# Patient Record
Sex: Female | Born: 1940 | Race: White | Hispanic: No | Smoking: Never smoker
Health system: Southern US, Community
[De-identification: ages and names within clinical notes are randomized; demographics above are authoritative.]

## PROBLEM LIST (undated history)

## (undated) DIAGNOSIS — J45909 Unspecified asthma, uncomplicated: Secondary | ICD-10-CM

## (undated) DIAGNOSIS — K5792 Diverticulitis of intestine, part unspecified, without perforation or abscess without bleeding: Secondary | ICD-10-CM

## (undated) DIAGNOSIS — E079 Disorder of thyroid, unspecified: Secondary | ICD-10-CM

## (undated) DIAGNOSIS — I10 Essential (primary) hypertension: Secondary | ICD-10-CM

## (undated) HISTORY — DX: Disorder of thyroid, unspecified: E07.9

## (undated) HISTORY — PX: ABDOMINAL HYSTERECTOMY: SHX81

## (undated) HISTORY — DX: Diverticulitis of intestine, part unspecified, without perforation or abscess without bleeding: K57.92

## (undated) HISTORY — DX: Essential (primary) hypertension: I10

## (undated) HISTORY — PX: CHOLECYSTECTOMY: SHX55

## (undated) HISTORY — PX: TUBAL LIGATION: SHX77

## (undated) HISTORY — PX: APPENDECTOMY: SHX54

## (undated) HISTORY — PX: ANTERIOR AND POSTERIOR REPAIR: SHX1172

---

## 2003-05-20 ENCOUNTER — Observation Stay (HOSPITAL_COMMUNITY): Admission: RE | Admit: 2003-05-20 | Discharge: 2003-05-21 | Payer: Self-pay | Admitting: Obstetrics & Gynecology

## 2005-07-27 ENCOUNTER — Ambulatory Visit (HOSPITAL_COMMUNITY): Admission: RE | Admit: 2005-07-27 | Discharge: 2005-07-27 | Payer: Self-pay | Admitting: Family Medicine

## 2005-08-03 ENCOUNTER — Ambulatory Visit (HOSPITAL_COMMUNITY): Admission: RE | Admit: 2005-08-03 | Discharge: 2005-08-03 | Payer: Self-pay | Admitting: General Surgery

## 2006-02-18 ENCOUNTER — Ambulatory Visit: Payer: Self-pay | Admitting: Internal Medicine

## 2006-03-06 ENCOUNTER — Ambulatory Visit (HOSPITAL_COMMUNITY): Admission: RE | Admit: 2006-03-06 | Discharge: 2006-03-06 | Payer: Self-pay | Admitting: Internal Medicine

## 2006-03-06 ENCOUNTER — Ambulatory Visit: Payer: Self-pay | Admitting: Internal Medicine

## 2006-04-05 ENCOUNTER — Ambulatory Visit: Payer: Self-pay | Admitting: Internal Medicine

## 2013-11-06 ENCOUNTER — Other Ambulatory Visit: Payer: Self-pay | Admitting: Obstetrics & Gynecology

## 2013-11-16 ENCOUNTER — Encounter: Payer: Self-pay | Admitting: Obstetrics & Gynecology

## 2013-11-16 ENCOUNTER — Ambulatory Visit (INDEPENDENT_AMBULATORY_CARE_PROVIDER_SITE_OTHER): Payer: Medicare Other | Admitting: Obstetrics & Gynecology

## 2013-11-16 ENCOUNTER — Other Ambulatory Visit (HOSPITAL_COMMUNITY)
Admission: RE | Admit: 2013-11-16 | Discharge: 2013-11-16 | Disposition: A | Discharge status: Home / Self Care | Payer: Medicare Other | Source: Ambulatory Visit | Attending: Obstetrics & Gynecology | Admitting: Obstetrics & Gynecology

## 2013-11-16 VITALS — BP 160/100 | Ht 64.0 in | Wt 226.0 lb

## 2013-11-16 DIAGNOSIS — Z01419 Encounter for gynecological examination (general) (routine) without abnormal findings: Secondary | ICD-10-CM

## 2013-11-16 DIAGNOSIS — Z124 Encounter for screening for malignant neoplasm of cervix: Secondary | ICD-10-CM

## 2013-11-16 DIAGNOSIS — Z1212 Encounter for screening for malignant neoplasm of rectum: Secondary | ICD-10-CM

## 2013-11-16 NOTE — Progress Notes (Signed)
Patient ID: Natasha Willis, female   DOB: 05-07-1941, 73 y.o.   MRN: [id number] Subjective:     Natasha Willis is a 73 y.o. female here for a routine exam.  No LMP recorded. No obstetric history on file. Birth Control Method:  na Menstrual Calendar(currently): na  Current complaints: none.   Current acute medical issues:  Vision loss   Recent Gynecologic History No LMP recorded. Last Pap: 2012,  normal Last mammogram: 2014,  normal  Past Medical History  Diagnosis Date  . Diverticulitis     History reviewed. No pertinent past surgical history.  OB History   Grav Para Term Preterm Abortions TAB SAB Ect Mult Living                  History   Social History  . Marital Status: Divorced    Spouse Name: N/A    Number of Children: N/A  . Years of Education: N/A   Social History Main Topics  . Smoking status: Never Smoker   . Smokeless tobacco: None  . Alcohol Use: None  . Drug Use: None  . Sexual Activity: None   Other Topics Concern  . None   Social History Narrative  . None    Family History  Problem Relation Age of Onset  . Diabetes Brother   . Heart disease Brother   . Diabetes Brother      Review of Systems  Review of Systems  Constitutional: Negative for fever, chills, weight loss, malaise/fatigue and diaphoresis.  HENT: Negative for hearing loss, ear pain, nosebleeds, congestion, sore throat, neck pain, tinnitus and ear discharge.   Eyes: Negative for blurred vision, double vision, photophobia, pain, discharge and redness.  Respiratory: Negative for cough, hemoptysis, sputum production, shortness of breath, wheezing and stridor.   Cardiovascular: Negative for chest pain, palpitations, orthopnea, claudication, leg swelling and PND.  Gastrointestinal: negative for abdominal pain. Negative for heartburn, nausea, vomiting, diarrhea, constipation, blood in stool and melena.  Genitourinary: Negative for dysuria, urgency, frequency, hematuria and  flank pain.  Musculoskeletal: Negative for myalgias, back pain, joint pain and falls.  Skin: Negative for itching and rash.  Neurological: Negative for dizziness, tingling, tremors, sensory change, speech change, focal weakness, seizures, loss of consciousness, weakness and headaches.  Endo/Heme/Allergies: Negative for environmental allergies and polydipsia. Does not bruise/bleed easily.  Psychiatric/Behavioral: Negative for depression, suicidal ideas, hallucinations, memory loss and substance abuse. The patient is not nervous/anxious and does not have insomnia.        Objective:    Physical Exam  Vitals reviewed. Constitutional: She is oriented to person, place, and time. She appears well-developed and well-nourished.  HENT:  Head: Normocephalic and atraumatic.        Right Ear: External ear normal.  Left Ear: External ear normal.  Nose: Nose normal.  Mouth/Throat: Oropharynx is clear and moist.  Eyes: Conjunctivae and EOM are normal. Pupils are equal, round, and reactive to light. Right eye exhibits no discharge. Left eye exhibits no discharge. No scleral icterus.  Neck: Normal range of motion. Neck supple. No tracheal deviation present. No thyromegaly present.  Cardiovascular: Normal rate, regular rhythm, normal heart sounds and intact distal pulses.  Exam reveals no gallop and no friction rub.   No murmur heard. Respiratory: Effort normal and breath sounds normal. No respiratory distress. She has no wheezes. She has no rales. She exhibits no tenderness.  GI: Soft. Bowel sounds are normal. She exhibits no distension and no mass. There is no  tenderness. There is no rebound and no guarding.  Genitourinary:  Breasts no masses skin changes or nipple changes bilaterally      Vulva is normal without lesions Vagina is pink moist without discharge Cervix normal in appearance and pap is done Uterus is normal size shape and contour Adnexa is negative with normal adnexa Rectal    hemoccult  negative, normal tone, no masses  Musculoskeletal: Normal range of motion. She exhibits no edema and no tenderness.  Neurological: She is alert and oriented to person, place, and time. She has normal reflexes. She displays normal reflexes. No cranial nerve deficit. She exhibits normal muscle tone. Coordination normal.  Skin: Skin is warm and dry. No rash noted. No erythema. No pallor.  Psychiatric: She has a normal mood and affect. Her behavior is normal. Judgment and thought content normal.       Assessment:   Normal gyn exam  Plan:    Follow up in: 2 years.

## 2013-11-16 NOTE — Addendum Note (Signed)
Addended by: Criss AlvinePULLIAM, Mehar Kirkwood G on: 11/16/2013 03:19 PM   Modules accepted: Orders

## 2013-12-15 DIAGNOSIS — H25019 Cortical age-related cataract, unspecified eye: Secondary | ICD-10-CM | POA: Diagnosis not present

## 2013-12-15 DIAGNOSIS — H52229 Regular astigmatism, unspecified eye: Secondary | ICD-10-CM | POA: Diagnosis not present

## 2013-12-15 DIAGNOSIS — H524 Presbyopia: Secondary | ICD-10-CM | POA: Diagnosis not present

## 2013-12-15 DIAGNOSIS — H5200 Hypermetropia, unspecified eye: Secondary | ICD-10-CM | POA: Diagnosis not present

## 2014-06-16 DIAGNOSIS — H25019 Cortical age-related cataract, unspecified eye: Secondary | ICD-10-CM | POA: Diagnosis not present

## 2014-08-02 DIAGNOSIS — H25811 Combined forms of age-related cataract, right eye: Secondary | ICD-10-CM | POA: Diagnosis not present

## 2014-08-02 DIAGNOSIS — H5203 Hypermetropia, bilateral: Secondary | ICD-10-CM | POA: Diagnosis not present

## 2014-08-02 DIAGNOSIS — H25813 Combined forms of age-related cataract, bilateral: Secondary | ICD-10-CM | POA: Diagnosis not present

## 2014-08-02 DIAGNOSIS — H5010 Unspecified exotropia: Secondary | ICD-10-CM | POA: Diagnosis not present

## 2014-08-02 DIAGNOSIS — H52223 Regular astigmatism, bilateral: Secondary | ICD-10-CM | POA: Diagnosis not present

## 2014-08-17 ENCOUNTER — Encounter (HOSPITAL_COMMUNITY)
Admission: RE | Admit: 2014-08-17 | Discharge: 2014-08-17 | Disposition: A | Discharge status: Home / Self Care | Payer: Medicare Other | Source: Ambulatory Visit | Attending: Ophthalmology | Admitting: Ophthalmology

## 2014-08-17 ENCOUNTER — Encounter (HOSPITAL_COMMUNITY): Payer: Self-pay

## 2014-08-17 ENCOUNTER — Other Ambulatory Visit: Payer: Self-pay

## 2014-08-17 DIAGNOSIS — J302 Other seasonal allergic rhinitis: Secondary | ICD-10-CM | POA: Diagnosis not present

## 2014-08-17 DIAGNOSIS — H25811 Combined forms of age-related cataract, right eye: Secondary | ICD-10-CM | POA: Diagnosis not present

## 2014-08-17 DIAGNOSIS — H52201 Unspecified astigmatism, right eye: Secondary | ICD-10-CM | POA: Diagnosis not present

## 2014-08-17 DIAGNOSIS — Z880 Allergy status to penicillin: Secondary | ICD-10-CM | POA: Diagnosis not present

## 2014-08-17 HISTORY — DX: Unspecified asthma, uncomplicated: J45.909

## 2014-08-17 LAB — CBC
HCT: 46.2 % — ABNORMAL HIGH (ref 36.0–46.0)
Hemoglobin: 15.8 g/dL — ABNORMAL HIGH (ref 12.0–15.0)
MCH: 31.8 pg (ref 26.0–34.0)
MCHC: 34.2 g/dL (ref 30.0–36.0)
MCV: 93 fL (ref 78.0–100.0)
Platelets: 223 10*3/uL (ref 150–400)
RBC: 4.97 MIL/uL (ref 3.87–5.11)
RDW: 13.5 % (ref 11.5–15.5)
WBC: 6.1 10*3/uL (ref 4.0–10.5)

## 2014-08-17 LAB — BASIC METABOLIC PANEL
Anion gap: 11 (ref 5–15)
BUN: 13 mg/dL (ref 6–23)
CO2: 29 mEq/L (ref 19–32)
Calcium: 9.7 mg/dL (ref 8.4–10.5)
Chloride: 102 mEq/L (ref 96–112)
Creatinine, Ser: 0.59 mg/dL (ref 0.50–1.10)
GFR calc Af Amer: >90 mL/min (ref >90)
GFR calc non Af Amer: 89 mL/min — ABNORMAL LOW (ref >90)
GLUCOSE: 117 mg/dL — AB (ref 70–99)
POTASSIUM: 4.5 meq/L (ref 3.7–5.3)
Sodium: 142 mEq/L (ref 137–147)

## 2014-08-17 NOTE — Pre-Procedure Instructions (Signed)
Patient given information to sign up for my chart at home. 

## 2014-08-17 NOTE — Patient Instructions (Signed)
Your procedure is scheduled on: 08/19/2014  Report to [facility] at  640  AM.  Call this number if you have problems the morning of surgery: [phone]   Do not eat food or drink liquids :After Midnight.      Take these medicines the morning of surgery with A SIP OF WATER: none   Do not wear jewelry, make-up or nail polish.  Do not wear lotions, powders, or perfumes.   Do not shave 48 hours prior to surgery.  Do not bring valuables to the hospital.  Contacts, dentures or bridgework may not be worn into surgery.  Leave suitcase in the car. After surgery it may be brought to your room.  For patients admitted to the hospital, checkout time is 11:00 AM the day of discharge.   Patients discharged the day of surgery will not be allowed to drive home.  :     Please read over the following fact sheets that you were given: Coughing and Deep Breathing, Surgical Site Infection Prevention, Anesthesia Post-op Instructions and Care and Recovery After Surgery    Cataract A cataract is a clouding of the lens of the eye. When a lens becomes cloudy, vision is reduced based on the degree and nature of the clouding. Many cataracts reduce vision to some degree. Some cataracts make people more near-sighted as they develop. Other cataracts increase glare. Cataracts that are ignored and become worse can sometimes look white. The white color can be seen through the pupil. CAUSES   Aging. However, cataracts may occur at any age, even in newborns.   Certain drugs.   Trauma to the eye.   Certain diseases such as diabetes.   Specific eye diseases such as chronic inflammation inside the eye or a sudden attack of a rare form of glaucoma.   Inherited or acquired medical problems.  SYMPTOMS   Gradual, progressive drop in vision in the affected eye.   Severe, rapid visual loss. This most often happens when trauma is the cause.  DIAGNOSIS  To detect a cataract, an eye doctor examines the lens. Cataracts are  best diagnosed with an exam of the eyes with the pupils enlarged (dilated) by drops.  TREATMENT  For an early cataract, vision may improve by using different eyeglasses or stronger lighting. If that does not help your vision, surgery is the only effective treatment. A cataract needs to be surgically removed when vision loss interferes with your everyday activities, such as driving, reading, or watching TV. A cataract may also have to be removed if it prevents examination or treatment of another eye problem. Surgery removes the cloudy lens and usually replaces it with a substitute lens (intraocular lens, IOL).  At a time when both you and your doctor agree, the cataract will be surgically removed. If you have cataracts in both eyes, only one is usually removed at a time. This allows the operated eye to heal and be out of danger from any possible problems after surgery (such as infection or poor wound healing). In rare cases, a cataract may be doing damage to your eye. In these cases, your caregiver may advise surgical removal right away. The vast majority of people who have cataract surgery have better vision afterward. HOME CARE INSTRUCTIONS  If you are not planning surgery, you may be asked to do the following:  Use different eyeglasses.   Use stronger or brighter lighting.   Ask your eye doctor about reducing your medicine dose or changing medicines if  it is thought that a medicine caused your cataract. Changing medicines does not make the cataract go away on its own.   Become familiar with your surroundings. Poor vision can lead to injury. Avoid bumping into things on the affected side. You are at a higher risk for tripping or falling.   Exercise extreme care when driving or operating machinery.   Wear sunglasses if you are sensitive to bright light or experiencing problems with glare.  SEEK IMMEDIATE MEDICAL CARE IF:   You have a worsening or sudden vision loss.   You notice redness,  swelling, or increasing pain in the eye.   You have a fever.  Document Released: 09/03/2005 Document Revised: 08/23/2011 Document Reviewed: 04/27/2011 [facility] Patient Information 2012 [place].PATIENT INSTRUCTIONS POST-ANESTHESIA  IMMEDIATELY FOLLOWING SURGERY:  Do not drive or operate machinery for the first twenty four hours after surgery.  Do not make any important decisions for twenty four hours after surgery or while taking narcotic pain medications or sedatives.  If you develop intractable nausea and vomiting or a severe headache please notify your doctor immediately.  FOLLOW-UP:  Please make an appointment with your surgeon as instructed. You do not need to follow up with anesthesia unless specifically instructed to do so.  WOUND CARE INSTRUCTIONS (if applicable):  Keep a dry clean dressing on the anesthesia/puncture wound site if there is drainage.  Once the wound has quit draining you may leave it open to air.  Generally you should leave the bandage intact for twenty four hours unless there is drainage.  If the epidural site drains for more than 36-48 hours please call the anesthesia department.  QUESTIONS?:  Please feel free to call your physician or the hospital operator if you have any questions, and they will be happy to assist you.

## 2014-08-18 MED ORDER — CYCLOPENTOLATE-PHENYLEPHRINE OP SOLN OPTIME - NO CHARGE
OPHTHALMIC | Status: AC
  Filled 2014-08-18: qty 2

## 2014-08-18 MED ORDER — LIDOCAINE HCL (PF) 1 % IJ SOLN
INTRAMUSCULAR | Status: AC
  Filled 2014-08-18: qty 2

## 2014-08-18 MED ORDER — LIDOCAINE HCL 3.5 % OP GEL
OPHTHALMIC | Status: AC
  Filled 2014-08-18: qty 1

## 2014-08-18 MED ORDER — NEOMYCIN-POLYMYXIN-DEXAMETH 3.5-10000-0.1 OP SUSP
OPHTHALMIC | Status: AC
  Filled 2014-08-18: qty 5

## 2014-08-18 MED ORDER — PHENYLEPHRINE HCL 2.5 % OP SOLN
OPHTHALMIC | Status: AC
  Filled 2014-08-18: qty 15

## 2014-08-18 MED ORDER — TETRACAINE HCL 0.5 % OP SOLN
OPHTHALMIC | Status: AC
  Filled 2014-08-18: qty 2

## 2014-08-19 ENCOUNTER — Ambulatory Visit (HOSPITAL_COMMUNITY)
Admission: RE | Admit: 2014-08-19 | Discharge: 2014-08-19 | Disposition: A | Discharge status: Home / Self Care | Payer: Medicare Other | Source: Ambulatory Visit | Attending: Ophthalmology | Admitting: Ophthalmology

## 2014-08-19 ENCOUNTER — Ambulatory Visit (HOSPITAL_COMMUNITY): Payer: Medicare Other | Admitting: Anesthesiology

## 2014-08-19 ENCOUNTER — Encounter (HOSPITAL_COMMUNITY)
Admission: RE | Disposition: A | Discharge status: Home / Self Care | Payer: Self-pay | Source: Ambulatory Visit | Attending: Ophthalmology

## 2014-08-19 ENCOUNTER — Encounter (HOSPITAL_COMMUNITY): Payer: Self-pay | Admitting: *Deleted

## 2014-08-19 DIAGNOSIS — J302 Other seasonal allergic rhinitis: Secondary | ICD-10-CM | POA: Diagnosis not present

## 2014-08-19 DIAGNOSIS — H25811 Combined forms of age-related cataract, right eye: Secondary | ICD-10-CM | POA: Insufficient documentation

## 2014-08-19 DIAGNOSIS — Z880 Allergy status to penicillin: Secondary | ICD-10-CM | POA: Insufficient documentation

## 2014-08-19 DIAGNOSIS — H25011 Cortical age-related cataract, right eye: Secondary | ICD-10-CM | POA: Diagnosis not present

## 2014-08-19 DIAGNOSIS — H52201 Unspecified astigmatism, right eye: Secondary | ICD-10-CM | POA: Diagnosis not present

## 2014-08-19 DIAGNOSIS — H259 Unspecified age-related cataract: Secondary | ICD-10-CM | POA: Diagnosis not present

## 2014-08-19 HISTORY — PX: CATARACT EXTRACTION W/PHACO: SHX586

## 2014-08-19 SURGERY — PHACOEMULSIFICATION, CATARACT, WITH IOL INSERTION
Anesthesia: Monitor Anesthesia Care | Site: Eye | Laterality: Right

## 2014-08-19 MED ORDER — LIDOCAINE HCL 3.5 % OP GEL
1.0000 "application " | Freq: Once | OPHTHALMIC | Status: AC
  Administered 2014-08-19: 1 via OPHTHALMIC

## 2014-08-19 MED ORDER — LIDOCAINE 3.5 % OP GEL OPTIME - NO CHARGE
OPHTHALMIC | Status: DC | PRN
  Administered 2014-08-19: 1 [drp] via OPHTHALMIC

## 2014-08-19 MED ORDER — MIDAZOLAM HCL 2 MG/2ML IJ SOLN
1.0000 mg | INTRAMUSCULAR | Status: DC | PRN
  Administered 2014-08-19: 2 mg via INTRAVENOUS

## 2014-08-19 MED ORDER — LACTATED RINGERS IV SOLN
INTRAVENOUS | Status: DC | PRN
  Administered 2014-08-19: 07:00:00 via INTRAVENOUS

## 2014-08-19 MED ORDER — PROVISC 10 MG/ML IO SOLN
INTRAOCULAR | Status: DC | PRN
  Administered 2014-08-19: 0.85 mL via INTRAOCULAR

## 2014-08-19 MED ORDER — EPINEPHRINE HCL 1 MG/ML IJ SOLN
INTRAMUSCULAR | Status: DC | PRN
  Administered 2014-08-19: 500 mL

## 2014-08-19 MED ORDER — NEOMYCIN-POLYMYXIN-DEXAMETH 3.5-10000-0.1 OP SUSP
OPHTHALMIC | Status: DC | PRN
  Administered 2014-08-19: 1 [drp] via OPHTHALMIC

## 2014-08-19 MED ORDER — POVIDONE-IODINE 5 % OP SOLN
OPHTHALMIC | Status: DC | PRN
  Administered 2014-08-19: 1 via OPHTHALMIC

## 2014-08-19 MED ORDER — PHENYLEPHRINE HCL 2.5 % OP SOLN
1.0000 [drp] | OPHTHALMIC | Status: AC
  Administered 2014-08-19 (×3): 1 [drp] via OPHTHALMIC

## 2014-08-19 MED ORDER — KETOROLAC TROMETHAMINE 0.5 % OP SOLN: 1.0000 [drp] | OPHTHALMIC | Status: DC

## 2014-08-19 MED ORDER — BSS IO SOLN
INTRAOCULAR | Status: DC | PRN
  Administered 2014-08-19: 30 mL via INTRAOCULAR

## 2014-08-19 MED ORDER — LACTATED RINGERS IV SOLN
INTRAVENOUS | Status: DC
  Administered 2014-08-19: 1000 mL via INTRAVENOUS

## 2014-08-19 MED ORDER — TETRACAINE HCL 0.5 % OP SOLN
1.0000 [drp] | OPHTHALMIC | Status: AC
  Administered 2014-08-19 (×3): 1 [drp] via OPHTHALMIC

## 2014-08-19 MED ORDER — FENTANYL CITRATE 0.05 MG/ML IJ SOLN
25.0000 ug | INTRAMUSCULAR | Status: AC
  Administered 2014-08-19 (×2): 25 ug via INTRAVENOUS

## 2014-08-19 MED ORDER — MIDAZOLAM HCL 2 MG/2ML IJ SOLN
INTRAMUSCULAR | Status: AC
Start: 2014-08-19 — End: 2014-08-19
  Filled 2014-08-19: qty 2

## 2014-08-19 MED ORDER — EPINEPHRINE HCL 1 MG/ML IJ SOLN
INTRAMUSCULAR | Status: AC
  Filled 2014-08-19: qty 1

## 2014-08-19 MED ORDER — FENTANYL CITRATE 0.05 MG/ML IJ SOLN
INTRAMUSCULAR | Status: AC
  Filled 2014-08-19: qty 2

## 2014-08-19 MED ORDER — CYCLOPENTOLATE-PHENYLEPHRINE 0.2-1 % OP SOLN
1.0000 [drp] | OPHTHALMIC | Status: AC
Start: 2014-08-19 — End: 2014-08-19
  Administered 2014-08-19 (×3): 1 [drp] via OPHTHALMIC

## 2014-08-19 MED ORDER — LIDOCAINE HCL (PF) 1 % IJ SOLN
INTRAMUSCULAR | Status: DC | PRN
  Administered 2014-08-19: .8 mL

## 2014-08-19 SURGICAL SUPPLY — 34 items
CAPSULAR TENSION RING-AMO (OPHTHALMIC RELATED) IMPLANT
CLOTH BEACON ORANGE TIMEOUT ST (SAFETY) ×3 IMPLANT
EYE SHIELD UNIVERSAL CLEAR (GAUZE/BANDAGES/DRESSINGS) ×3 IMPLANT
GLOVE BIO SURGEON STRL SZ 6.5 (GLOVE) IMPLANT
GLOVE BIO SURGEONS STRL SZ 6.5 (GLOVE)
GLOVE BIOGEL PI IND STRL 6.5 (GLOVE) ×2 IMPLANT
GLOVE BIOGEL PI IND STRL 7.0 (GLOVE) IMPLANT
GLOVE BIOGEL PI IND STRL 7.5 (GLOVE) IMPLANT
GLOVE BIOGEL PI INDICATOR 6.5 (GLOVE) ×4
GLOVE BIOGEL PI INDICATOR 7.0 (GLOVE)
GLOVE BIOGEL PI INDICATOR 7.5 (GLOVE)
GLOVE ECLIPSE 6.5 STRL STRAW (GLOVE) IMPLANT
GLOVE ECLIPSE 7.0 STRL STRAW (GLOVE) IMPLANT
GLOVE ECLIPSE 7.5 STRL STRAW (GLOVE) IMPLANT
GLOVE EXAM NITRILE LRG STRL (GLOVE) IMPLANT
GLOVE EXAM NITRILE MD LF STRL (GLOVE) IMPLANT
GLOVE SKINSENSE NS SZ6.5 (GLOVE)
GLOVE SKINSENSE NS SZ7.0 (GLOVE)
GLOVE SKINSENSE STRL SZ6.5 (GLOVE) IMPLANT
GLOVE SKINSENSE STRL SZ7.0 (GLOVE) IMPLANT
KIT VITRECTOMY (OPHTHALMIC RELATED) IMPLANT
LENS IOL ACRYSOF IQ TORIC 15.0 ×3 IMPLANT
PAD ARMBOARD 7.5X6 YLW CONV (MISCELLANEOUS) ×3 IMPLANT
PROC W NO LENS (INTRAOCULAR LENS)
PROC W SPEC LENS (INTRAOCULAR LENS) ×3
PROCESS W NO LENS (INTRAOCULAR LENS) IMPLANT
PROCESS W SPEC LENS (INTRAOCULAR LENS) ×1 IMPLANT
RETRACTOR IRIS SIGHTPATH (OPHTHALMIC RELATED) IMPLANT
RING MALYGIN (MISCELLANEOUS) IMPLANT
SYRINGE LUER LOK 1CC (MISCELLANEOUS) ×3 IMPLANT
TAPE SURG TRANSPORE 1 IN (GAUZE/BANDAGES/DRESSINGS) ×1 IMPLANT
TAPE SURGICAL TRANSPORE 1 IN (GAUZE/BANDAGES/DRESSINGS) ×2
VISCOELASTIC ADDITIONAL (OPHTHALMIC RELATED) IMPLANT
WATER STERILE IRR 250ML POUR (IV SOLUTION) ×3 IMPLANT

## 2014-08-19 NOTE — Discharge Instructions (Signed)

## 2014-08-19 NOTE — Op Note (Signed)
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  Assistants: None  Anesthesia: Topical with MAC  Indications: Painless, progressive loss of vision with compromise of daily activities.  Surgery: Cataract Extraction with Intraocular lens Implant Right Eye  Discription: The patient had dilating drops and viscous lidocaine placed into the Right in the pre-op holding area. In the sitting position horizontal reference marks were made on the cornea.  After transfer to the operating room, a time out was performed. The patient was then prepped and draped. Beginning with a 75 degree blade a paracentesis port was made at the surgeon's 2 o'clock position. The anterior chamber was then filled with 1% non-preserved lidocaine. This was followed by filling the anterior chamber with Provisc. A 2.174mm keratome blade was used to make a clear cornea incision at the temporal limbus. A bent cystatome needle was used to create a continuous tear capsulotomy. Hydrodissection was performed with balanced salt solution on a Fine canula. The lens nucleus was then removed using the phacoemulsification handpiece. Residual cortex was removed with the I&A handpiece. The anterior chamber and capsular bag were refilled with Provisc. A posterior chamber intraocular lens was placed into the capsular bag with it's injector. Additional corneal marks were made on the 112/292 degree meridians.  The Provisc was then removed from the anterior chamber and capsular bag with the I&A handpiece. The implant was positioned with the Kuglan hook. Stromal hydration of the main incision and paracentesis port was performed with BSS on a Fine canula. The wounds were tested for leak which was negative. The patient tolerated the procedure well. There were no operative complications.  The patient was then transferred to the recovery room in stable condition.  Complications: None  Specimen: None  EBL: None  Prosthetic device: Alcon AcrySof  Toric, [id number], power 22.5SE, SN [id number].

## 2014-08-19 NOTE — Transfer of Care (Signed)
Immediate Anesthesia Transfer of Care Note  Patient: Natasha Willis  Procedure(s) Performed: Procedure(s) with comments: CATARACT EXTRACTION PHACO AND INTRAOCULAR LENS PLACEMENT RIGHT EYE (Right) - CDE:7.60  Patient Location: Short Stay  Anesthesia Type:MAC  Level of Consciousness: awake, alert , oriented and patient cooperative  Airway & Oxygen Therapy: Patient Spontanous Breathing  Post-op Assessment: Report given to PACU RN and Post -op Vital signs reviewed and stable  Post vital signs: Reviewed and stable  Complications: No apparent anesthesia complications

## 2014-08-19 NOTE — Anesthesia Postprocedure Evaluation (Signed)
  Anesthesia Post-op Note  Patient: Natasha CobbsElizabeth J Sozio  Procedure(s) Performed: Procedure(s) with comments: CATARACT EXTRACTION PHACO AND INTRAOCULAR LENS PLACEMENT RIGHT EYE (Right) - CDE:7.60  Patient Location: Short Stay  Anesthesia Type:MAC  Level of Consciousness: awake, alert , oriented and patient cooperative  Airway and Oxygen Therapy: Patient Spontanous Breathing  Post-op Pain: none  Post-op Assessment: Post-op Vital signs reviewed, Patient's Cardiovascular Status Stable, Respiratory Function Stable, Patent Airway, No signs of Nausea or vomiting and No headache  Post-op Vital Signs: Reviewed and stable  Last Vitals:  Filed Vitals:   08/19/14 0745  BP: 149/88  Pulse:   Temp:   Resp: 23    Complications: No apparent anesthesia complications

## 2014-08-19 NOTE — H&P (Signed)
I have reviewed the H&P, the patient was re-examined, and I have identified no interval changes in medical condition and plan of care since the history and physical of record  

## 2014-08-19 NOTE — Anesthesia Preprocedure Evaluation (Signed)
Anesthesia Evaluation  Patient identified by MRN, date of birth, ID band Patient awake    Reviewed: Allergy & Precautions, H&P , NPO status , Patient's Chart, lab work & pertinent test results  Airway Mallampati: II  TM Distance: >3 FB     Dental   Pulmonary asthma (seasonal allergies) ,  breath sounds clear to auscultation        Cardiovascular negative cardio ROS  Rhythm:Regular Rate:Normal     Neuro/Psych    GI/Hepatic negative GI ROS,   Endo/Other    Renal/GU      Musculoskeletal   Abdominal   Peds  Hematology   Anesthesia Other Findings   Reproductive/Obstetrics                             Anesthesia Physical Anesthesia Plan  ASA: II  Anesthesia Plan: MAC   Post-op Pain Management:    Induction: Intravenous  Airway Management Planned: Nasal Cannula  Additional Equipment:   Intra-op Plan:   Post-operative Plan:   Informed Consent: I have reviewed the patients History and Physical, chart, labs and discussed the procedure including the risks, benefits and alternatives for the proposed anesthesia with the patient or authorized representative who has indicated his/her understanding and acceptance.     Plan Discussed with:   Anesthesia Plan Comments:         Anesthesia Quick Evaluation  

## 2014-08-19 NOTE — Anesthesia Procedure Notes (Signed)
Procedure Name: MAC Date/Time: 08/19/2014 7:49 AM Performed by: Pernell DupreADAMS, Aala Ransom A Pre-anesthesia Checklist: Patient identified, Timeout performed, Emergency Drugs available, Suction available and Patient being monitored Oxygen Delivery Method: Nasal cannula

## 2014-08-20 ENCOUNTER — Encounter (HOSPITAL_COMMUNITY): Payer: Self-pay | Admitting: Ophthalmology

## 2014-08-23 DIAGNOSIS — H25812 Combined forms of age-related cataract, left eye: Secondary | ICD-10-CM | POA: Diagnosis not present

## 2014-08-23 DIAGNOSIS — Z961 Presence of intraocular lens: Secondary | ICD-10-CM | POA: Diagnosis not present

## 2014-08-25 ENCOUNTER — Encounter (HOSPITAL_COMMUNITY)
Admission: RE | Admit: 2014-08-25 | Discharge: 2014-08-25 | Disposition: A | Discharge status: Home / Self Care | Payer: Medicare Other | Source: Ambulatory Visit | Attending: Ophthalmology | Admitting: Ophthalmology

## 2014-08-27 MED ORDER — NEOMYCIN-POLYMYXIN-DEXAMETH 3.5-10000-0.1 OP SUSP
OPHTHALMIC | Status: AC
  Filled 2014-08-27: qty 5

## 2014-08-27 MED ORDER — TETRACAINE HCL 0.5 % OP SOLN
OPHTHALMIC | Status: AC
  Filled 2014-08-27: qty 2

## 2014-08-27 MED ORDER — PHENYLEPHRINE HCL 2.5 % OP SOLN
OPHTHALMIC | Status: AC
  Filled 2014-08-27: qty 15

## 2014-08-27 MED ORDER — LIDOCAINE HCL (PF) 1 % IJ SOLN
INTRAMUSCULAR | Status: AC
  Filled 2014-08-27: qty 2

## 2014-08-27 MED ORDER — CYCLOPENTOLATE-PHENYLEPHRINE OP SOLN OPTIME - NO CHARGE
OPHTHALMIC | Status: AC
  Filled 2014-08-27: qty 2

## 2014-08-27 MED ORDER — LIDOCAINE HCL 3.5 % OP GEL
OPHTHALMIC | Status: AC
  Filled 2014-08-27: qty 1

## 2014-08-30 ENCOUNTER — Ambulatory Visit (HOSPITAL_COMMUNITY)
Admission: RE | Admit: 2014-08-30 | Discharge: 2014-08-30 | Disposition: A | Discharge status: Home / Self Care | Payer: Medicare Other | Source: Ambulatory Visit | Attending: Ophthalmology | Admitting: Ophthalmology

## 2014-08-30 ENCOUNTER — Encounter (HOSPITAL_COMMUNITY): Payer: Self-pay | Admitting: *Deleted

## 2014-08-30 ENCOUNTER — Encounter (HOSPITAL_COMMUNITY)
Admission: RE | Disposition: A | Discharge status: Home / Self Care | Payer: Self-pay | Source: Ambulatory Visit | Attending: Ophthalmology

## 2014-08-30 ENCOUNTER — Ambulatory Visit (HOSPITAL_COMMUNITY): Payer: Medicare Other | Admitting: Anesthesiology

## 2014-08-30 DIAGNOSIS — H25812 Combined forms of age-related cataract, left eye: Secondary | ICD-10-CM | POA: Insufficient documentation

## 2014-08-30 DIAGNOSIS — H259 Unspecified age-related cataract: Secondary | ICD-10-CM | POA: Diagnosis not present

## 2014-08-30 DIAGNOSIS — H52202 Unspecified astigmatism, left eye: Secondary | ICD-10-CM | POA: Diagnosis not present

## 2014-08-30 HISTORY — PX: CATARACT EXTRACTION W/PHACO: SHX586

## 2014-08-30 SURGERY — PHACOEMULSIFICATION, CATARACT, WITH IOL INSERTION
Anesthesia: Monitor Anesthesia Care | Site: Eye | Laterality: Left

## 2014-08-30 MED ORDER — POVIDONE-IODINE 5 % OP SOLN
OPHTHALMIC | Status: DC | PRN
  Administered 2014-08-30: 1 via OPHTHALMIC

## 2014-08-30 MED ORDER — FENTANYL CITRATE 0.05 MG/ML IJ SOLN
INTRAMUSCULAR | Status: AC
  Filled 2014-08-30: qty 2

## 2014-08-30 MED ORDER — MIDAZOLAM HCL 2 MG/2ML IJ SOLN
1.0000 mg | INTRAMUSCULAR | Status: DC | PRN
  Administered 2014-08-30: 2 mg via INTRAVENOUS

## 2014-08-30 MED ORDER — EPINEPHRINE HCL 1 MG/ML IJ SOLN
INTRAOCULAR | Status: DC | PRN
  Administered 2014-08-30: 13:00:00

## 2014-08-30 MED ORDER — TETRACAINE HCL 0.5 % OP SOLN
1.0000 [drp] | OPHTHALMIC | Status: AC
  Administered 2014-08-30 (×3): 1 [drp] via OPHTHALMIC

## 2014-08-30 MED ORDER — LACTATED RINGERS IV SOLN
INTRAVENOUS | Status: DC
  Administered 2014-08-30: 13:00:00 via INTRAVENOUS

## 2014-08-30 MED ORDER — PROVISC 10 MG/ML IO SOLN
INTRAOCULAR | Status: DC | PRN
  Administered 2014-08-30: 0.85 mL via INTRAOCULAR

## 2014-08-30 MED ORDER — NEOMYCIN-POLYMYXIN-DEXAMETH 3.5-10000-0.1 OP SUSP
OPHTHALMIC | Status: DC | PRN
  Administered 2014-08-30: 1 [drp] via OPHTHALMIC

## 2014-08-30 MED ORDER — LIDOCAINE HCL (PF) 1 % IJ SOLN
INTRAMUSCULAR | Status: DC | PRN
  Administered 2014-08-30: .5 mL

## 2014-08-30 MED ORDER — LIDOCAINE HCL 3.5 % OP GEL
1.0000 "application " | Freq: Once | OPHTHALMIC | Status: AC
  Administered 2014-08-30: 1 via OPHTHALMIC

## 2014-08-30 MED ORDER — PHENYLEPHRINE HCL 2.5 % OP SOLN
1.0000 [drp] | OPHTHALMIC | Status: AC
  Administered 2014-08-30 (×3): 1 [drp] via OPHTHALMIC

## 2014-08-30 MED ORDER — FENTANYL CITRATE 0.05 MG/ML IJ SOLN
25.0000 ug | INTRAMUSCULAR | Status: AC
Start: 2014-08-30 — End: 2014-08-30
  Administered 2014-08-30 (×2): 25 ug via INTRAVENOUS

## 2014-08-30 MED ORDER — CYCLOPENTOLATE-PHENYLEPHRINE 0.2-1 % OP SOLN
1.0000 [drp] | OPHTHALMIC | Status: AC
  Administered 2014-08-30 (×3): 1 [drp] via OPHTHALMIC

## 2014-08-30 MED ORDER — EPINEPHRINE HCL 1 MG/ML IJ SOLN
INTRAMUSCULAR | Status: AC
  Filled 2014-08-30: qty 1

## 2014-08-30 MED ORDER — BSS IO SOLN
INTRAOCULAR | Status: DC | PRN
  Administered 2014-08-30: 30 mL via INTRAOCULAR

## 2014-08-30 MED ORDER — MIDAZOLAM HCL 2 MG/2ML IJ SOLN
INTRAMUSCULAR | Status: AC
  Filled 2014-08-30: qty 2

## 2014-08-30 MED ORDER — LIDOCAINE 3.5 % OP GEL OPTIME - NO CHARGE
OPHTHALMIC | Status: DC | PRN
  Administered 2014-08-30: 1 [drp] via OPHTHALMIC

## 2014-08-30 SURGICAL SUPPLY — 34 items
CAPSULAR TENSION RING-AMO (OPHTHALMIC RELATED) IMPLANT
CLOTH BEACON ORANGE TIMEOUT ST (SAFETY) ×3 IMPLANT
EYE SHIELD UNIVERSAL CLEAR (GAUZE/BANDAGES/DRESSINGS) ×3 IMPLANT
GLOVE BIO SURGEON STRL SZ 6.5 (GLOVE) IMPLANT
GLOVE BIO SURGEONS STRL SZ 6.5 (GLOVE)
GLOVE BIOGEL PI IND STRL 6.5 (GLOVE) ×2 IMPLANT
GLOVE BIOGEL PI IND STRL 7.0 (GLOVE) IMPLANT
GLOVE BIOGEL PI IND STRL 7.5 (GLOVE) IMPLANT
GLOVE BIOGEL PI INDICATOR 6.5 (GLOVE) ×4
GLOVE BIOGEL PI INDICATOR 7.0 (GLOVE)
GLOVE BIOGEL PI INDICATOR 7.5 (GLOVE)
GLOVE ECLIPSE 6.5 STRL STRAW (GLOVE) IMPLANT
GLOVE ECLIPSE 7.0 STRL STRAW (GLOVE) IMPLANT
GLOVE ECLIPSE 7.5 STRL STRAW (GLOVE) IMPLANT
GLOVE EXAM NITRILE LRG STRL (GLOVE) IMPLANT
GLOVE EXAM NITRILE MD LF STRL (GLOVE) IMPLANT
GLOVE SKINSENSE NS SZ6.5 (GLOVE)
GLOVE SKINSENSE NS SZ7.0 (GLOVE)
GLOVE SKINSENSE STRL SZ6.5 (GLOVE) IMPLANT
GLOVE SKINSENSE STRL SZ7.0 (GLOVE) IMPLANT
KIT VITRECTOMY (OPHTHALMIC RELATED) IMPLANT
LENS IOL ACRYSOF IQ TORIC 15.0 ×3 IMPLANT
PAD ARMBOARD 7.5X6 YLW CONV (MISCELLANEOUS) ×3 IMPLANT
PROC W NO LENS (INTRAOCULAR LENS)
PROC W SPEC LENS (INTRAOCULAR LENS) ×3
PROCESS W NO LENS (INTRAOCULAR LENS) IMPLANT
PROCESS W SPEC LENS (INTRAOCULAR LENS) ×1 IMPLANT
RETRACTOR IRIS SIGHTPATH (OPHTHALMIC RELATED) IMPLANT
RING MALYGIN (MISCELLANEOUS) IMPLANT
SYRINGE LUER LOK 1CC (MISCELLANEOUS) ×3 IMPLANT
TAPE SURG TRANSPORE 1 IN (GAUZE/BANDAGES/DRESSINGS) ×1 IMPLANT
TAPE SURGICAL TRANSPORE 1 IN (GAUZE/BANDAGES/DRESSINGS) ×2
VISCOELASTIC ADDITIONAL (OPHTHALMIC RELATED) IMPLANT
WATER STERILE IRR 250ML POUR (IV SOLUTION) ×3 IMPLANT

## 2014-08-30 NOTE — H&P (Signed)
I have reviewed the H&P, the patient was re-examined, and I have identified no interval changes in medical condition and plan of care since the history and physical of record  

## 2014-08-30 NOTE — Anesthesia Preprocedure Evaluation (Signed)
Anesthesia Evaluation  Patient identified by MRN, date of birth, ID band Patient awake    Reviewed: Allergy & Precautions, H&P , NPO status , Patient's Chart, lab work & pertinent test results  Airway Mallampati: II  TM Distance: >3 FB     Dental   Pulmonary asthma (seasonal allergies) ,  breath sounds clear to auscultation        Cardiovascular negative cardio ROS  Rhythm:Regular Rate:Normal     Neuro/Psych    GI/Hepatic negative GI ROS,   Endo/Other    Renal/GU      Musculoskeletal   Abdominal   Peds  Hematology   Anesthesia Other Findings   Reproductive/Obstetrics                             Anesthesia Physical Anesthesia Plan  ASA: II  Anesthesia Plan: MAC   Post-op Pain Management:    Induction: Intravenous  Airway Management Planned: Nasal Cannula  Additional Equipment:   Intra-op Plan:   Post-operative Plan:   Informed Consent: I have reviewed the patients History and Physical, chart, labs and discussed the procedure including the risks, benefits and alternatives for the proposed anesthesia with the patient or authorized representative who has indicated his/her understanding and acceptance.     Plan Discussed with:   Anesthesia Plan Comments:         Anesthesia Quick Evaluation

## 2014-08-30 NOTE — Transfer of Care (Signed)
Immediate Anesthesia Transfer of Care Note  Patient: Natasha Willis  Procedure(s) Performed: Procedure(s) with comments: CATARACT EXTRACTION PHACO AND INTRAOCULAR LENS PLACEMENT (IOC) (Left) - CDE:5.49  Patient Location: Short Stay  Anesthesia Type:MAC  Level of Consciousness: awake  Airway & Oxygen Therapy: Patient Spontanous Breathing  Post-op Assessment: Report given to PACU RN  Post vital signs: Reviewed  Complications: No apparent anesthesia complications

## 2014-08-30 NOTE — Discharge Instructions (Signed)

## 2014-08-30 NOTE — Anesthesia Postprocedure Evaluation (Signed)
  Anesthesia Post-op Note  Patient: Natasha CobbsElizabeth J Simonis  Procedure(s) Performed: Procedure(s) with comments: CATARACT EXTRACTION PHACO AND INTRAOCULAR LENS PLACEMENT (IOC) (Left) - CDE:5.49  Patient Location: Short Stay  Anesthesia Type:MAC  Level of Consciousness: awake, alert  and oriented  Airway and Oxygen Therapy: Patient Spontanous Breathing  Post-op Pain: none  Post-op Assessment: Post-op Vital signs reviewed, Patient's Cardiovascular Status Stable, Respiratory Function Stable, Patent Airway and No signs of Nausea or vomiting  Post-op Vital Signs: Reviewed and stable  Last Vitals:  Filed Vitals:   08/30/14 1315  BP: 130/75  Pulse:   Temp:   Resp: 14    Complications: No apparent anesthesia complications

## 2014-08-30 NOTE — Op Note (Signed)
Date of Admission: 08/30/2014  Date of Surgery: 08/30/2014  Pre-Op Dx: Cataract Left Eye  Post-Op Dx: Senile Combined Cataract Left Eye,  Dx Code H25.812, Astigmatism Left Eye, Dx Code H52.2  Surgeon: Gemma PayorKerry Graylon Amory, M.D.  Assistants: None  Anesthesia: Topical with MAC  Indications: Painless, progressive loss of vision with compromise of daily activities.  Surgery: Cataract Extraction with Intraocular lens Implant Left Eye  Discription: The patient had dilating drops and viscous lidocaine placed into the Left in the pre-op holding area. In the sitting position horizontal reference marks were made on the cornea.  After transfer to the operating room, a time out was performed. The patient was then prepped and draped. Beginning with a 75 degree blade a paracentesis port was made at the surgeon's 2 o'clock position. The anterior chamber was then filled with 1% non-preserved lidocaine. This was followed by filling the anterior chamber with Provisc. A 2.404mm keratome blade was used to make a clear cornea incision at the temporal limbus. A bent cystatome needle was used to create a continuous tear capsulotomy. Hydrodissection was performed with balanced salt solution on a Fine canula. The lens nucleus was then removed using the phacoemulsification handpiece. Residual cortex was removed with the I&A handpiece. The anterior chamber and capsular bag were refilled with Provisc. A posterior chamber intraocular lens was placed into the capsular bag with it's injector. Additional corneal marks were made on the 100/280 degree meridians.  The Provisc was then removed from the anterior chamber and capsular bag with the I&A handpiece. The implant was positioned with the Kuglan hook. Stromal hydration of the main incision and paracentesis port was performed with BSS on a Fine canula. The wounds were tested for leak which was negative. The patient tolerated the procedure well. There were no operative complications. The  patient was then transferred to the recovery room in stable condition.  Complications: None  Specimen: None  EBL: None  Prosthetic device: Alcon AcrySof Toric [id number], power 21.[place], [place] [id number].

## 2014-09-01 ENCOUNTER — Encounter (HOSPITAL_COMMUNITY): Payer: Self-pay | Admitting: Ophthalmology

## 2015-12-06 ENCOUNTER — Ambulatory Visit (INDEPENDENT_AMBULATORY_CARE_PROVIDER_SITE_OTHER): Payer: Medicare Other | Admitting: Obstetrics & Gynecology

## 2015-12-06 ENCOUNTER — Encounter: Payer: Self-pay | Admitting: Obstetrics & Gynecology

## 2015-12-06 VITALS — BP 160/90 | HR 74 | Ht 67.2 in | Wt 219.0 lb

## 2015-12-06 DIAGNOSIS — Z01411 Encounter for gynecological examination (general) (routine) with abnormal findings: Secondary | ICD-10-CM

## 2015-12-06 DIAGNOSIS — B373 Candidiasis of vulva and vagina: Secondary | ICD-10-CM | POA: Diagnosis not present

## 2015-12-06 DIAGNOSIS — Z1211 Encounter for screening for malignant neoplasm of colon: Secondary | ICD-10-CM

## 2015-12-06 DIAGNOSIS — B3731 Acute candidiasis of vulva and vagina: Secondary | ICD-10-CM

## 2015-12-06 DIAGNOSIS — Z1212 Encounter for screening for malignant neoplasm of rectum: Secondary | ICD-10-CM

## 2015-12-06 DIAGNOSIS — R739 Hyperglycemia, unspecified: Secondary | ICD-10-CM | POA: Diagnosis not present

## 2015-12-06 DIAGNOSIS — Z01419 Encounter for gynecological examination (general) (routine) without abnormal findings: Secondary | ICD-10-CM

## 2015-12-06 MED ORDER — FLUCONAZOLE 100 MG PO TABS: 100.0000 mg | ORAL_TABLET | Freq: Every day | ORAL | Status: DC

## 2015-12-06 NOTE — Progress Notes (Signed)
Patient ID: Natasha Willis, female   DOB: 16-Sep-1941, 74 y.o.   MRN: [id number] Subjective:     Natasha Willis is a 75 y.o. female here for a routine exam.  No LMP recorded. Patient has had a hysterectomy. No obstetric history on file. Birth Control Method:  Hysterectomy Menstrual Calendar(currently):   Current complaints: Vulvar itching and irritation.   Current acute medical issues:     Recent Gynecologic History No LMP recorded. Patient has had a hysterectomy. Last Pap: ,   Last mammogram: ,    Past Medical History  Diagnosis Date  . Diverticulitis   . Asthma     Past Surgical History  Procedure Laterality Date  . Abdominal hysterectomy    . Appendectomy    . Cholecystectomy    . Tubal ligation    . Anterior and posterior repair    . Cataract extraction w/phaco Right 08/19/2014    Procedure: CATARACT EXTRACTION PHACO AND INTRAOCULAR LENS PLACEMENT RIGHT EYE;  Surgeon: Gemma Payor, MD;  Location: [facility];  Service: Ophthalmology;  Laterality: Right;  CDE:7.60  . Cataract extraction w/phaco Left 08/30/2014    Procedure: CATARACT EXTRACTION PHACO AND INTRAOCULAR LENS PLACEMENT (IOC);  Surgeon: Gemma Payor, MD;  Location: [facility];  Service: Ophthalmology;  Laterality: Left;  CDE:5.49    OB History    No data available      Social History   Social History  . Marital Status: Divorced    Spouse Name: N/A  . Number of Children: N/A  . Years of Education: N/A   Social History Main Topics  . Smoking status: Never Smoker   . Smokeless tobacco: None  . Alcohol Use: No  . Drug Use: No  . Sexual Activity: Yes    Birth Control/ Protection: Surgical   Other Topics Concern  . None   Social History Narrative    Family History  Problem Relation Age of Onset  . Diabetes Brother   . Heart disease Brother   . Diabetes Brother      Current outpatient prescriptions:  .  BESIVANCE 0.6 % SUSP, Apply 1 drop to eye 3 (three) times daily., Disp: , Rfl: 0 .  Fish  Oil-Cholecalciferol (FISH OIL + D3) 1000-1000 MG-UNIT CAPS, Take 1 capsule by mouth daily. , Disp: , Rfl:  .  Multiple Vitamins-Minerals (CENTRAL-VITE FOR SENIORS PO), Take 1 tablet by mouth daily. , Disp: , Rfl:  .  fluconazole (DIFLUCAN) 100 MG tablet, Take 1 tablet (100 mg total) by mouth daily., Disp: 14 tablet, Rfl: 0  Review of Systems  Review of Systems  Constitutional: Negative for fever, chills, weight loss, malaise/fatigue and diaphoresis.  HENT: Negative for hearing loss, ear pain, nosebleeds, congestion, sore throat, neck pain, tinnitus and ear discharge.   Eyes: Negative for blurred vision, double vision, photophobia, pain, discharge and redness.  Respiratory: Negative for cough, hemoptysis, sputum production, shortness of breath, wheezing and stridor.   Cardiovascular: Negative for chest pain, palpitations, orthopnea, claudication, leg swelling and PND.  Gastrointestinal: negative for abdominal pain. Negative for heartburn, nausea, vomiting, diarrhea, constipation, blood in stool and melena.  Genitourinary: Negative for dysuria, urgency, frequency, hematuria and flank pain.  Musculoskeletal: Negative for myalgias, back pain, joint pain and falls.  Skin: Negative for itching and rash.  Neurological: Negative for dizziness, tingling, tremors, sensory change, speech change, focal weakness, seizures, loss of consciousness, weakness and headaches.  Endo/Heme/Allergies: Negative for environmental allergies and polydipsia. Does not bruise/bleed easily.  Psychiatric/Behavioral: Negative for depression,  suicidal ideas, hallucinations, memory loss and substance abuse. The patient is not nervous/anxious and does not have insomnia.        Objective:  Blood pressure 160/90, pulse 74, height 5' 7.2" (1.707 m), weight 219 lb (99.338 kg).   Physical Exam  Vitals reviewed. Constitutional: She is oriented to person, place, and time. She appears well-developed and well-nourished.  HENT:  Head:  Normocephalic and atraumatic.        Right Ear: External ear normal.  Left Ear: External ear normal.  Nose: Nose normal.  Mouth/Throat: Oropharynx is clear and moist.  Eyes: Conjunctivae and EOM are normal. Pupils are equal, round, and reactive to light. Right eye exhibits no discharge. Left eye exhibits no discharge. No scleral icterus.  Neck: Normal range of motion. Neck supple. No tracheal deviation present. No thyromegaly present.  Cardiovascular: Normal rate, regular rhythm, normal heart sounds and intact distal pulses.  Exam reveals no gallop and no friction rub.   No murmur heard. Respiratory: Effort normal and breath sounds normal. No respiratory distress. She has no wheezes. She has no rales. She exhibits no tenderness.  GI: Soft. Bowel sounds are normal. She exhibits no distension and no mass. There is no tenderness. There is no rebound and no guarding.  Genitourinary:  Breasts no masses skin changes or nipple changes bilaterally      Vulva is normal without lesions Vagina is pink moist without discharge Cervix absent Uterus absent Adnexa is negative  {Rectal    hemoccult negative, normal tone, no masses  Musculoskeletal: Normal range of motion. She exhibits no edema and no tenderness.  Neurological: She is alert and oriented to person, place, and time. She has normal reflexes. She displays normal reflexes. No cranial nerve deficit. She exhibits normal muscle tone. Coordination normal.  Skin: Skin is warm and dry. No rash noted. No erythema. No pallor.  Psychiatric: She has a normal mood and affect. Her behavior is normal. Judgment and thought content normal.       Assessment:    .    Plan:    Follow up in: 2 weeks.       ICD-9-CM ICD-10-CM   1. Well woman exam with routine gynecological exam V72.31 Z01.419   2. Hyperglycemia 790.29 R73.9 HgB A1c  3. Yeast infection involving the vagina and surrounding area 112.1 B37.3     Meds ordered this encounter  Medications   . fluconazole (DIFLUCAN) 100 MG tablet    Sig: Take 1 tablet (100 mg total) by mouth daily.    Dispense:  14 tablet    Refill:  0    Orders Placed This Encounter  Procedures  . HgB A1c

## 2015-12-07 LAB — HEMOGLOBIN A1C
ESTIMATED AVERAGE GLUCOSE: 131 mg/dL
Hgb A1c MFr Bld: 6.2 % — ABNORMAL HIGH (ref 4.8–5.6)

## 2015-12-14 DIAGNOSIS — H5203 Hypermetropia, bilateral: Secondary | ICD-10-CM | POA: Diagnosis not present

## 2015-12-14 DIAGNOSIS — H524 Presbyopia: Secondary | ICD-10-CM | POA: Diagnosis not present

## 2015-12-14 DIAGNOSIS — H52223 Regular astigmatism, bilateral: Secondary | ICD-10-CM | POA: Diagnosis not present

## 2015-12-14 DIAGNOSIS — H43811 Vitreous degeneration, right eye: Secondary | ICD-10-CM | POA: Diagnosis not present

## 2015-12-20 ENCOUNTER — Ambulatory Visit (INDEPENDENT_AMBULATORY_CARE_PROVIDER_SITE_OTHER): Payer: Medicare Other | Admitting: Obstetrics & Gynecology

## 2015-12-20 ENCOUNTER — Encounter: Payer: Self-pay | Admitting: Obstetrics & Gynecology

## 2015-12-20 VITALS — BP 160/90 | HR 76 | Wt 215.0 lb

## 2015-12-20 DIAGNOSIS — R7303 Prediabetes: Secondary | ICD-10-CM | POA: Diagnosis not present

## 2015-12-20 DIAGNOSIS — B3731 Acute candidiasis of vulva and vagina: Secondary | ICD-10-CM

## 2015-12-20 DIAGNOSIS — B373 Candidiasis of vulva and vagina: Secondary | ICD-10-CM

## 2015-12-20 NOTE — Progress Notes (Signed)
Patient ID: Natasha Willis, female   DOB: [birth year], 75 y.o.   MRN: [id number]      Chief Complaint  Patient presents with  . Follow-up    lab result    Blood pressure 160/90, pulse 76, weight 215 lb (97.523 kg).  75 y.o. No obstetric history on file. No LMP recorded. Patient has had a hysterectomy. The current method of family planning is status post hysterectomy.  Subjective Pt here for recheck of her vulvar vaginal yeast  Also follow up her Hemoglobin A1C:  6.2  Objective +vulvar yeast still present, painted with gentian violet  Pertinent ROS No burning with urination, frequency or urgency No nausea, vomiting or diarrhea Nor fever chills or other constitutional symptoms   Labs or studies A1C: 6.2--> pre diabetes but on a definite glidepath    Impression Diagnoses this Encounter::   ICD-9-CM ICD-10-CM   1. Yeast vaginitis 112.1 B37.3   2. Pre-diabetes 790.29 R73.03     Established relevant diagnosis(es):   Plan/Recommendations: No orders of the defined types were placed in this encounter.    Labs or Scans Ordered: No orders of the defined types were placed in this encounter.    Management:: Discussed at length diet options for her pre diabetes state Will re check in 6 months  Follow up Return in about 6 months (around 06/20/2016) for Follow up, with Dr Despina HiddenEure.        Face to face time:  15 minutes  Greater than 50% of the visit time was spent in counseling and coordination of care with the patient.  The summary and outline of the counseling and care coordination is summarized in the note above.   All questions were answered.

## 2016-01-30 ENCOUNTER — Telehealth: Payer: Self-pay | Admitting: Internal Medicine

## 2016-01-30 NOTE — Telephone Encounter (Signed)
PT ON RECALL FOR TCS

## 2016-01-31 NOTE — Telephone Encounter (Signed)
Letter mailed to pt.  

## 2016-06-21 ENCOUNTER — Ambulatory Visit: Payer: Medicare Other | Admitting: Obstetrics & Gynecology

## 2016-06-22 ENCOUNTER — Encounter (INDEPENDENT_AMBULATORY_CARE_PROVIDER_SITE_OTHER): Payer: Self-pay

## 2016-06-22 ENCOUNTER — Ambulatory Visit (INDEPENDENT_AMBULATORY_CARE_PROVIDER_SITE_OTHER): Payer: Medicare Other | Admitting: Obstetrics & Gynecology

## 2016-06-22 ENCOUNTER — Encounter: Payer: Self-pay | Admitting: Obstetrics & Gynecology

## 2016-06-22 VITALS — BP 140/98 | HR 70 | Ht 67.5 in | Wt 195.0 lb

## 2016-06-22 DIAGNOSIS — R030 Elevated blood-pressure reading, without diagnosis of hypertension: Secondary | ICD-10-CM

## 2016-06-22 DIAGNOSIS — R739 Hyperglycemia, unspecified: Secondary | ICD-10-CM

## 2016-06-22 DIAGNOSIS — B3731 Acute candidiasis of vulva and vagina: Secondary | ICD-10-CM

## 2016-06-22 DIAGNOSIS — B373 Candidiasis of vulva and vagina: Secondary | ICD-10-CM

## 2016-06-22 MED ORDER — NYSTATIN 100000 UNIT/GM EX CREA: 1.0000 "application " | TOPICAL_CREAM | Freq: Two times a day (BID) | CUTANEOUS | 11 refills | Status: DC

## 2016-06-22 NOTE — Progress Notes (Signed)
Chief Complaint  Patient presents with  . Follow-up    yeast vaginitis    Blood pressure (!) 140/98, pulse 70, height 5' 7.5" (1.715 m), weight 195 lb (88.5 kg).  75 y.o. No obstetric history on file. No LMP recorded. Patient has had a hysterectomy. The current method of family planning is status post hysterectomy.  Outpatient Encounter Prescriptions as of 06/22/2016  Medication Sig  . BESIVANCE 0.6 % SUSP Apply 1 drop to eye 3 (three) times daily.  . Fish Oil-Cholecalciferol (FISH OIL + D3) 1000-1000 MG-UNIT CAPS Take 1 capsule by mouth daily.   . fluconazole (DIFLUCAN) 100 MG tablet Take 1 tablet (100 mg total) by mouth daily.  . Multiple Vitamins-Minerals (CENTRAL-VITE FOR SENIORS PO) Take 1 tablet by mouth daily.   Marland Kitchen. nystatin cream (MYCOSTATIN) Apply 1 application topically 2 (two) times daily.   No facility-administered encounter medications on file as of 06/22/2016.     Subjective Pt with borderline hemoglobin A1C diagnosed at her visit in March when she presented for yeast vulvitis She has been following a diet and lost a little weight No yeast symptoms  Objective Gen WDWN female NAD Vulva still chronic skin changes consistent with yest but pt is without symptoms  No specific lesions or ulcers  Treated with gentian violet painting  Pertinent ROS No burning with urination, frequency or urgency No nausea, vomiting or diarrhea Nor fever chills or other constitutional symptoms   Labs or studies     Impression Diagnoses this Encounter::   ICD-9-CM ICD-10-CM   1. Vulvovaginitis due to yeast 112.1 B37.3   2. Borderline hyperglycemia 790.29 R73.9 HgB A1c    Established relevant diagnosis(es):   Plan/Recommendations: Meds ordered this encounter  Medications  . nystatin cream (MYCOSTATIN)    Sig: Apply 1 application topically 2 (two) times daily.    Dispense:  30 g    Refill:  11    Labs or Scans Ordered: Orders Placed This Encounter  Procedures    . HgB A1c    Management:: Use nystatin prn  Follow up Return in about 1 year (around 06/22/2017).        All questions were answered.  Past Medical History:  Diagnosis Date  . Asthma   . Diverticulitis     Past Surgical History:  Procedure Laterality Date  . ABDOMINAL HYSTERECTOMY    . ANTERIOR AND POSTERIOR REPAIR    . APPENDECTOMY    . CATARACT EXTRACTION W/PHACO Right 08/19/2014   Procedure: CATARACT EXTRACTION PHACO AND INTRAOCULAR LENS PLACEMENT RIGHT EYE;  Surgeon: Gemma PayorKerry Hunt, MD;  Location: [facility];  Service: Ophthalmology;  Laterality: Right;  CDE:7.60  . CATARACT EXTRACTION W/PHACO Left 08/30/2014   Procedure: CATARACT EXTRACTION PHACO AND INTRAOCULAR LENS PLACEMENT (IOC);  Surgeon: Gemma PayorKerry Hunt, MD;  Location: [facility];  Service: Ophthalmology;  Laterality: Left;  CDE:5.49  . CHOLECYSTECTOMY    . TUBAL LIGATION      OB History    No data available      Allergies  Allergen Reactions  . Penicillins     Social History   Social History  . Marital status: Divorced    Spouse name: N/A  . Number of children: N/A  . Years of education: N/A   Social History Main Topics  . Smoking status: Never Smoker  . Smokeless tobacco: Never Used  . Alcohol use No  . Drug use: No  . Sexual activity: Not Currently    Birth control/ protection: Surgical  Other Topics Concern  . None   Social History Narrative  . None    Family History  Problem Relation Age of Onset  . Diabetes Brother   . Heart disease Brother   . Diabetes Brother

## 2016-06-23 LAB — HEMOGLOBIN A1C
ESTIMATED AVERAGE GLUCOSE: 120 mg/dL
Hgb A1c MFr Bld: 5.8 % — ABNORMAL HIGH (ref 4.8–5.6)

## 2016-08-01 ENCOUNTER — Telehealth: Payer: Self-pay | Admitting: Obstetrics & Gynecology

## 2016-08-03 NOTE — Telephone Encounter (Signed)
Pt informed A1C 06/22/2016 was 5.8. Pt informed A1C has improved since 8 mos ago.

## 2017-06-07 ENCOUNTER — Encounter: Payer: Self-pay | Admitting: Obstetrics & Gynecology

## 2017-06-07 ENCOUNTER — Ambulatory Visit (INDEPENDENT_AMBULATORY_CARE_PROVIDER_SITE_OTHER): Payer: Medicare Other | Admitting: Obstetrics & Gynecology

## 2017-06-07 VITALS — BP 148/80 | HR 74 | Ht 63.0 in | Wt 192.0 lb

## 2017-06-07 DIAGNOSIS — Z1211 Encounter for screening for malignant neoplasm of colon: Secondary | ICD-10-CM | POA: Diagnosis not present

## 2017-06-07 DIAGNOSIS — Z01419 Encounter for gynecological examination (general) (routine) without abnormal findings: Secondary | ICD-10-CM | POA: Diagnosis not present

## 2017-06-07 DIAGNOSIS — B373 Candidiasis of vulva and vagina: Secondary | ICD-10-CM | POA: Diagnosis not present

## 2017-06-07 DIAGNOSIS — Z1212 Encounter for screening for malignant neoplasm of rectum: Secondary | ICD-10-CM

## 2017-06-07 DIAGNOSIS — B3731 Acute candidiasis of vulva and vagina: Secondary | ICD-10-CM

## 2017-06-07 MED ORDER — NYSTATIN 100000 UNIT/GM EX CREA: 1.0000 "application " | TOPICAL_CREAM | Freq: Two times a day (BID) | CUTANEOUS | 11 refills | Status: DC

## 2017-06-07 NOTE — Progress Notes (Signed)
Subjective:     Natasha Willis is a 76 y.o. female here for a routine exam.  No LMP recorded. Patient has had a hysterectomy. G5P0 Birth Control Method:  hysterectomy Menstrual Calendar(currently): amenorrheic  Current complaints: candidal vulvovaginitis.   Current acute medical issues:  none   Recent Gynecologic History No LMP recorded. Patient has had a hysterectomy. Last Pap: ,   Last mammogram: pt declines to get mammograms,    Past Medical History:  Diagnosis Date  . Asthma   . Diverticulitis     Past Surgical History:  Procedure Laterality Date  . ABDOMINAL HYSTERECTOMY    . ANTERIOR AND POSTERIOR REPAIR    . APPENDECTOMY    . CATARACT EXTRACTION W/PHACO Right 08/19/2014   Procedure: CATARACT EXTRACTION PHACO AND INTRAOCULAR LENS PLACEMENT RIGHT EYE;  Surgeon: Gemma Payor, MD;  Location: [facility];  Service: Ophthalmology;  Laterality: Right;  CDE:7.60  . CATARACT EXTRACTION W/PHACO Left 08/30/2014   Procedure: CATARACT EXTRACTION PHACO AND INTRAOCULAR LENS PLACEMENT (IOC);  Surgeon: Gemma Payor, MD;  Location: [facility];  Service: Ophthalmology;  Laterality: Left;  CDE:5.49  . CHOLECYSTECTOMY    . TUBAL LIGATION      OB History    Gravida Para Term Preterm AB Living   5         5   SAB TAB Ectopic Multiple Live Births                  Social History   Social History  . Marital status: Divorced    Spouse name: N/A  . Number of children: N/A  . Years of education: N/A   Social History Main Topics  . Smoking status: Never Smoker  . Smokeless tobacco: Never Used  . Alcohol use No  . Drug use: No  . Sexual activity: Not Currently    Birth control/ protection: Surgical   Other Topics Concern  . None   Social History Narrative  . None    Family History  Problem Relation Age of Onset  . Diabetes Brother   . Heart disease Brother   . Diabetes Brother   . Heart disease Brother      Current Outpatient Prescriptions:  .  BESIVANCE 0.6 % SUSP, Apply 1  drop to eye 3 (three) times daily., Disp: , Rfl: 0 .  Fish Oil-Cholecalciferol (FISH OIL + D3) 1000-1000 MG-UNIT CAPS, Take 1 capsule by mouth daily. , Disp: , Rfl:  .  Multiple Vitamins-Minerals (CENTRAL-VITE FOR SENIORS PO), Take 1 tablet by mouth daily. , Disp: , Rfl:  .  nystatin cream (MYCOSTATIN), Apply 1 application topically 2 (two) times daily., Disp: 30 g, Rfl: 11  Review of Systems  Review of Systems  Constitutional: Negative for fever, chills, weight loss, malaise/fatigue and diaphoresis.  HENT: Negative for hearing loss, ear pain, nosebleeds, congestion, sore throat, neck pain, tinnitus and ear discharge.   Eyes: Negative for blurred vision, double vision, photophobia, pain, discharge and redness.  Respiratory: Negative for cough, hemoptysis, sputum production, shortness of breath, wheezing and stridor.   Cardiovascular: Negative for chest pain, palpitations, orthopnea, claudication, leg swelling and PND.  Gastrointestinal: negative for abdominal pain. Negative for heartburn, nausea, vomiting, diarrhea, constipation, blood in stool and melena.  Genitourinary: Negative for dysuria, urgency, frequency, hematuria and flank pain.  Musculoskeletal: Negative for myalgias, back pain, joint pain and falls.  Skin: Negative for itching and rash.  Neurological: Negative for dizziness, tingling, tremors, sensory change, speech change, focal weakness, seizures, loss  of consciousness, weakness and headaches.  Endo/Heme/Allergies: Negative for environmental allergies and polydipsia. Does not bruise/bleed easily.  Psychiatric/Behavioral: Negative for depression, suicidal ideas, hallucinations, memory loss and substance abuse. The patient is not nervous/anxious and does not have insomnia.        Objective:  Blood pressure (!) 148/80, pulse 74, height  (1.6 m), weight 192 lb (87.1 kg).   Physical Exam  Vitals reviewed. Constitutional: She is oriented to person, place, and time. She appears  well-developed and well-nourished.  HENT:  Head: Normocephalic and atraumatic.        Right Ear: External ear normal.  Left Ear: External ear normal.  Nose: Nose normal.  Mouth/Throat: Oropharynx is clear and moist.  Eyes: Conjunctivae and EOM are normal. Pupils are equal, round, and reactive to light. Right eye exhibits no discharge. Left eye exhibits no discharge. No scleral icterus.  Neck: Normal range of motion. Neck supple. No tracheal deviation present. No thyromegaly present.  Cardiovascular: Normal rate, regular rhythm, normal heart sounds and intact distal pulses.  Exam reveals no gallop and no friction rub.   No murmur heard. Respiratory: Effort normal and breath sounds normal. No respiratory distress. She has no wheezes. She has no rales. She exhibits no tenderness.  GI: Soft. Bowel sounds are normal. She exhibits no distension and no mass. There is no tenderness. There is no rebound and no guarding.  Genitourinary:  Breasts no masses skin changes or nipple changes bilaterally      Vulva is normal without lesions Vagina is pink moist without discharge Cervix absent Uterus is absent Adnexa is negative  {Rectal    hemoccult negative, normal tone, no masses  Musculoskeletal: Normal range of motion. She exhibits no edema and no tenderness.  Neurological: She is alert and oriented to person, place, and time. She has normal reflexes. She displays normal reflexes. No cranial nerve deficit. She exhibits normal muscle tone. Coordination normal.  Skin: Skin is warm and dry. No rash noted. No erythema. No pallor.  Psychiatric: She has a normal mood and affect. Her behavior is normal. Judgment and thought content normal.      gentain violet place on vuvla and underneath breasts  Medications Ordered at today's visit: Meds ordered this encounter  Medications  . nystatin cream (MYCOSTATIN)    Sig: Apply 1 application topically 2 (two) times daily.    Dispense:  30 g    Refill:  11     Other orders placed at today's visit: No orders of the defined types were placed in this encounter.     Assessment:    Healthy female exam.   yeast chronic Plan:    pt declines mammogram     Return in about 2 years (around 06/08/2019).

## 2018-07-08 ENCOUNTER — Other Ambulatory Visit: Payer: Self-pay | Admitting: Obstetrics & Gynecology

## 2018-07-22 DIAGNOSIS — Z0100 Encounter for examination of eyes and vision without abnormal findings: Secondary | ICD-10-CM | POA: Diagnosis not present

## 2018-07-22 DIAGNOSIS — H5200 Hypermetropia, unspecified eye: Secondary | ICD-10-CM | POA: Diagnosis not present

## 2019-02-26 ENCOUNTER — Telehealth: Payer: Self-pay | Admitting: Obstetrics & Gynecology

## 2019-02-26 NOTE — Telephone Encounter (Signed)
Pt would like to see Dr. Elonda Husky for her pap/physical and is wanting to see when she could get in for an appt with him.

## 2019-04-20 ENCOUNTER — Other Ambulatory Visit: Payer: Medicare Other | Admitting: Adult Health

## 2019-06-05 ENCOUNTER — Telehealth: Payer: Self-pay | Admitting: Obstetrics & Gynecology

## 2019-06-05 NOTE — Telephone Encounter (Signed)
Called patient regarding appointment scheduled in our office encouraged to come alone to the visit if possible, however, a support person, over age 77, may accompany her  to appointment if assistance is needed for safety or care concerns. Otherwise, support persons should remain outside until the visit is complete.  ° °We ask if you have had any exposure to anyone suspected or confirmed of having COVID-19 or if you are experiencing any of the following, to call and reschedule your appointment: fever, cough, shortness of breath, muscle pain, diarrhea, rash, vomiting, abdominal pain, red eye, weakness, bruising, bleeding, joint pain, or a severe headache.  ° °Please know we will ask you these questions or similar questions when you arrive for your appointment and again it’s how we are keeping everyone safe.   ° °Also,to keep you safe, please use the provided hand sanitizer when you enter the office. We are asking everyone in the office to wear a mask to help prevent the spread of °germs. If you have a mask of your own, please wear it to your appointment, if not, we are happy to provide one for you. ° °Thank you for understanding and your cooperation.  ° ° °[facility] Staff ° ° ° °

## 2019-06-08 ENCOUNTER — Other Ambulatory Visit (HOSPITAL_COMMUNITY): Payer: Self-pay | Admitting: Obstetrics & Gynecology

## 2019-06-08 ENCOUNTER — Other Ambulatory Visit: Payer: Self-pay

## 2019-06-08 ENCOUNTER — Ambulatory Visit (INDEPENDENT_AMBULATORY_CARE_PROVIDER_SITE_OTHER): Payer: Medicare HMO | Admitting: Obstetrics & Gynecology

## 2019-06-08 ENCOUNTER — Encounter: Payer: Self-pay | Admitting: Obstetrics & Gynecology

## 2019-06-08 VITALS — BP 177/94 | HR 72 | Ht 67.2 in | Wt 194.0 lb

## 2019-06-08 DIAGNOSIS — IMO0002 Reserved for concepts with insufficient information to code with codable children: Secondary | ICD-10-CM

## 2019-06-08 DIAGNOSIS — Z1211 Encounter for screening for malignant neoplasm of colon: Secondary | ICD-10-CM | POA: Diagnosis not present

## 2019-06-08 DIAGNOSIS — Z1212 Encounter for screening for malignant neoplasm of rectum: Secondary | ICD-10-CM

## 2019-06-08 DIAGNOSIS — Z01419 Encounter for gynecological examination (general) (routine) without abnormal findings: Secondary | ICD-10-CM | POA: Diagnosis not present

## 2019-06-08 DIAGNOSIS — N6320 Unspecified lump in the left breast, unspecified quadrant: Secondary | ICD-10-CM

## 2019-06-08 NOTE — Progress Notes (Signed)
Subjective:     Natasha Willis is a 78 y.o. female here for a routine exam.  No LMP recorded. Patient has had a hysterectomy. G5P0 Birth Control Method:  hysterectomy Menstrual Calendar(currently):   Current complaints: none.   Current acute medical issues:     Recent Gynecologic History No LMP recorded. Patient has had a hysterectomy. Last Pap: n/a,   Last mammogram: years ago has declined over the years,    Past Medical History:  Diagnosis Date  . Asthma   . Diverticulitis     Past Surgical History:  Procedure Laterality Date  . ABDOMINAL HYSTERECTOMY    . ANTERIOR AND POSTERIOR REPAIR    . APPENDECTOMY    . CATARACT EXTRACTION W/PHACO Right 08/19/2014   Procedure: CATARACT EXTRACTION PHACO AND INTRAOCULAR LENS PLACEMENT RIGHT EYE;  Surgeon: Tonny Branch, MD;  Location: [facility];  Service: Ophthalmology;  Laterality: Right;  CDE:7.60  . CATARACT EXTRACTION W/PHACO Left 08/30/2014   Procedure: CATARACT EXTRACTION PHACO AND INTRAOCULAR LENS PLACEMENT (IOC);  Surgeon: Tonny Branch, MD;  Location: [facility];  Service: Ophthalmology;  Laterality: Left;  CDE:5.49  . CHOLECYSTECTOMY    . TUBAL LIGATION      OB History    Gravida  5   Para      Term      Preterm      AB      Living  5     SAB      TAB      Ectopic      Multiple      Live Births              Social History   Socioeconomic History  . Marital status: Divorced    Spouse name: Not on file  . Number of children: 5  . Years of education: Not on file  . Highest education level: Not on file  Occupational History  . Not on file  Social Needs  . Financial resource strain: Not on file  . Food insecurity    Worry: Not on file    Inability: Not on file  . Transportation needs    Medical: Not on file    Non-medical: Not on file  Tobacco Use  . Smoking status: Never Smoker  . Smokeless tobacco: Never Used  Substance and Sexual Activity  . Alcohol use: No  . Drug use: No  . Sexual activity:  Not Currently    Birth control/protection: Surgical  Lifestyle  . Physical activity    Days per week: Not on file    Minutes per session: Not on file  . Stress: Not on file  Relationships  . Social Herbalist on phone: Not on file    Gets together: Not on file    Attends religious service: Not on file    Active member of club or organization: Not on file    Attends meetings of clubs or organizations: Not on file    Relationship status: Not on file  Other Topics Concern  . Not on file  Social History Narrative  . Not on file    Family History  Problem Relation Age of Onset  . Diabetes Brother   . Heart disease Brother   . Diabetes Brother   . Heart disease Brother      Current Outpatient Medications:  .  BESIVANCE 0.6 % SUSP, Apply 1 drop to eye 3 (three) times daily., Disp: , Rfl: 0 .  Fish  Oil-Cholecalciferol (FISH OIL + D3) 1000-1000 MG-UNIT CAPS, Take 1 capsule by mouth daily. , Disp: , Rfl:  .  Multiple Vitamins-Minerals (CENTRAL-VITE FOR SENIORS PO), Take 1 tablet by mouth daily. , Disp: , Rfl:  .  nystatin cream (MYCOSTATIN), Apply 1 application topically 2 (two) times daily. (Patient not taking: Reported on 06/08/2019), Disp: 30 g, Rfl: 11 .  nystatin cream (MYCOSTATIN), APPLY TO AFFECTED AREAS TWICE DAILY. (Patient not taking: Reported on 06/08/2019), Disp: 30 g, Rfl: 11  Review of Systems  Review of Systems  Constitutional: Negative for fever, chills, weight loss, malaise/fatigue and diaphoresis.  HENT: Negative for hearing loss, ear pain, nosebleeds, congestion, sore throat, neck pain, tinnitus and ear discharge.   Eyes: Negative for blurred vision, double vision, photophobia, pain, discharge and redness.  Respiratory: Negative for cough, hemoptysis, sputum production, shortness of breath, wheezing and stridor.   Cardiovascular: Negative for chest pain, palpitations, orthopnea, claudication, leg swelling and PND.  Gastrointestinal: negative for abdominal  pain. Negative for heartburn, nausea, vomiting, diarrhea, constipation, blood in stool and melena.  Genitourinary: Negative for dysuria, urgency, frequency, hematuria and flank pain.  Musculoskeletal: Negative for myalgias, back pain, joint pain and falls.  Skin: Negative for itching and rash.  Neurological: Negative for dizziness, tingling, tremors, sensory change, speech change, focal weakness, seizures, loss of consciousness, weakness and headaches.  Endo/Heme/Allergies: Negative for environmental allergies and polydipsia. Does not bruise/bleed easily.  Psychiatric/Behavioral: Negative for depression, suicidal ideas, hallucinations, memory loss and substance abuse. The patient is not nervous/anxious and does not have insomnia.        Objective:  Blood pressure (!) 177/94, pulse 72, height 5' 7.2" (1.707 m), weight 194 lb (88 kg).   Physical Exam  Vitals reviewed. Constitutional: She is oriented to person, place, and time. She appears well-developed and well-nourished.  HENT:  Head: Normocephalic and atraumatic.        Right Ear: External ear normal.  Left Ear: External ear normal.  Nose: Nose normal.  Mouth/Throat: Oropharynx is clear and moist.  Eyes: Conjunctivae and EOM are normal. Pupils are equal, round, and reactive to light. Right eye exhibits no discharge. Left eye exhibits no discharge. No scleral icterus.  Neck: Normal range of motion. Neck supple. No tracheal deviation present. No thyromegaly present.  Cardiovascular: Normal rate, regular rhythm, normal heart sounds and intact distal pulses.  Exam reveals no gallop and no friction rub.   No murmur heard. Respiratory: Effort normal and breath sounds normal. No respiratory distress. She has no wheezes. She has no rales. She exhibits no tenderness.  GI: Soft. Bowel sounds are normal. She exhibits no distension and no mass. There is no tenderness. There is no rebound and no guarding.  Genitourinary:  Breasts right breast  normal Left breast with retracted left nipple pt reports as stable, 3 x 3 cm retro aereolar mass non tender non fixed no axilaary nodes palpable      Vulva is atrophic Vagina is atrophic Cervix absent Uterus is absent Adnexa is negative {Rectal    hemoccult negative, normal tone, no masses  Musculoskeletal: Normal range of motion. She exhibits no edema and no tenderness.  Neurological: She is alert and oriented to person, place, and time. She has normal reflexes. She displays normal reflexes. No cranial nerve deficit. She exhibits normal muscle tone. Coordination normal.  Skin: Skin is warm and dry. No rash noted. No erythema. No pallor.  Psychiatric: She has a normal mood and affect. Her behavior is normal. Judgment and  thought content normal.       Medications Ordered at today's visit: No orders of the defined types were placed in this encounter.   Other orders placed at today's visit: Diagnostic mammogram and breast sonogram    Assessment:     Post menopausal female with new diagnosis left breast mass, asymptomatic.    Plan:       Tuesday June 30 819 for diagnostic mammogram and breast ultrasound for evaluation of solitary asymptomatic left breast mass  No follow-ups on file.

## 2019-06-22 ENCOUNTER — Ambulatory Visit (INDEPENDENT_AMBULATORY_CARE_PROVIDER_SITE_OTHER): Payer: Medicare HMO | Admitting: Family Medicine

## 2019-06-22 ENCOUNTER — Other Ambulatory Visit: Payer: Self-pay

## 2019-06-22 ENCOUNTER — Encounter: Payer: Self-pay | Admitting: Family Medicine

## 2019-06-22 VITALS — BP 168/87 | HR 70 | Temp 98.2°F | Ht 67.5 in | Wt 193.0 lb

## 2019-06-22 DIAGNOSIS — R030 Elevated blood-pressure reading, without diagnosis of hypertension: Secondary | ICD-10-CM

## 2019-06-22 DIAGNOSIS — K5792 Diverticulitis of intestine, part unspecified, without perforation or abscess without bleeding: Secondary | ICD-10-CM

## 2019-06-22 DIAGNOSIS — R7303 Prediabetes: Secondary | ICD-10-CM | POA: Insufficient documentation

## 2019-06-22 DIAGNOSIS — T7840XS Allergy, unspecified, sequela: Secondary | ICD-10-CM

## 2019-06-22 DIAGNOSIS — Z13820 Encounter for screening for osteoporosis: Secondary | ICD-10-CM | POA: Diagnosis not present

## 2019-06-22 DIAGNOSIS — Z0001 Encounter for general adult medical examination with abnormal findings: Secondary | ICD-10-CM | POA: Insufficient documentation

## 2019-06-22 DIAGNOSIS — T7840XA Allergy, unspecified, initial encounter: Secondary | ICD-10-CM | POA: Insufficient documentation

## 2019-06-22 LAB — POCT GLYCOSYLATED HEMOGLOBIN (HGB A1C): Hemoglobin A1C: 5.6 % (ref 4.0–5.6)

## 2019-06-22 NOTE — Patient Instructions (Addendum)
Check blood pressure at home -first thing in the morning  Suggest you start blood pressure medication-elevated blood pressure noted over last few years when seeing GYN.  Consider taking a flu shot this year  Bone density-screening for osteoporosis-Call to see if you can schedule at the same time as your mammogram  mucinex -Take twice a day (600mg )  Trial of flonase -2 sprays to each nostril daily

## 2019-06-22 NOTE — Progress Notes (Signed)
New Patient Office Visit  Subjective:  Patient ID: Natasha Willis, female    DOB: [birth year]  Age: 78 y.o. MRN: [id number]  CC:  Chief Complaint  Patient presents with  . Establish Care  . Nasal Congestion    HPI Natasha Willis presents for allergies-no use of inhaler-past h/o asthma. Pt states green thick drainage at time with clear at times. 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 Asthma   . Diverticulitis     Past Surgical History:  Procedure Laterality Date  . ABDOMINAL HYSTERECTOMY    . ANTERIOR AND POSTERIOR REPAIR    . APPENDECTOMY    . CATARACT EXTRACTION W/PHACO Right 08/19/2014   Procedure: CATARACT EXTRACTION PHACO AND INTRAOCULAR LENS PLACEMENT RIGHT EYE;  Surgeon: Gemma Payor, MD;  Location: [facility];  Service: Ophthalmology;  Laterality: Right;  CDE:7.60  . CATARACT EXTRACTION W/PHACO Left 08/30/2014   Procedure: CATARACT EXTRACTION PHACO AND INTRAOCULAR LENS PLACEMENT (IOC);  Surgeon: Gemma Payor, MD;  Location: [facility];  Service: Ophthalmology;  Laterality: Left;  CDE:5.49  . CHOLECYSTECTOMY    . TUBAL LIGATION      Family History  Problem Relation Age of Onset  . Diabetes Brother   . Heart disease Brother   . Diabetes Brother   . Heart disease Brother     Social History   Socioeconomic History  . Marital status: Divorced    Spouse name: Not on file  . Number of children: 5  . Years of education: Not on file  . Highest education level: Not on file  Occupational History  . Not on file  Social Needs  . Financial resource strain: Not on file  . Food insecurity    Worry: Not on file    Inability: Not on file  . Transportation needs    Medical: Not on file   Non-medical: Not on file  Tobacco Use  . Smoking status: Never Smoker  . Smokeless tobacco: Never Used  Substance and Sexual Activity  . Alcohol use: No  . Drug use: No  . Sexual activity: Not Currently    Birth control/protection: Surgical  Lifestyle  . Physical activity    Days per week: Not on file    Minutes per session: Not on file  . Stress: Not on file  Relationships  . Social Musician on phone: Not on file    Gets together: Not on file    Attends religious service: Not on file    Active member of club or organization: Not on file    Attends meetings of clubs or organizations: Not on file    Relationship status: Not on file  . Intimate partner violence    Fear of current or ex partner: Not on file    Emotionally abused: Not on file    Physically abused: Not on file    Forced sexual activity: Not on file  Other Topics Concern  . Not on file  Social History Narrative  . Not on file    ROS Review of Systems  Constitutional: Negative.   HENT: Positive for congestion. Negative for hearing loss and sneezing.   Eyes:       Implants bilat  Respiratory:  Asthma   Cardiovascular: Negative.   Gastrointestinal:       Diverticulitis  Endocrine:       Pre-diabetes  Genitourinary:       Pap smear normal 2015  Musculoskeletal: Negative.   Allergic/Immunologic: Positive for environmental allergies.  Neurological: Negative.   Hematological: Negative.   Psychiatric/Behavioral: Negative.     Objective:   Today's Vitals: BP (!) 168/87 (BP Location: Left Arm, Patient Position: Sitting, Cuff Size: Normal)   Pulse 70   Temp 98.2 F (36.8 C) (Oral)   Ht 5' 7.5" (1.715 m)   Wt 193 lb (87.5 kg)   SpO2 95%   BMI 29.78 kg/m   Physical Exam Constitutional:      Appearance: Normal appearance.  HENT:     Head: Normocephalic and atraumatic.     Right Ear: Tympanic membrane, ear canal and external ear normal.     Left Ear: Tympanic membrane, ear canal  and external ear normal.     Nose: Congestion present.     Mouth/Throat:     Mouth: Mucous membranes are moist.  Eyes:     Conjunctiva/sclera: Conjunctivae normal.  Neck:     Musculoskeletal: Normal range of motion and neck supple.  Cardiovascular:     Rate and Rhythm: Normal rate and regular rhythm.     Pulses: Normal pulses.     Heart sounds: Normal heart sounds.  Pulmonary:     Effort: Pulmonary effort is normal.     Breath sounds: Normal breath sounds.  Abdominal:     General: Bowel sounds are normal.     Palpations: Abdomen is soft.  Musculoskeletal: Normal range of motion.        General: Swelling present.     Right lower leg: Edema present.     Left lower leg: Edema present.  Neurological:     Mental Status: She is alert and oriented to person, place, and time.  Psychiatric:        Mood and Affect: Mood normal.        Behavior: Behavior normal.     Assessment & Plan:  1. Diverticulitis - CBC with Differential Pt uses otc meds for constipation, avoids foods that irritate 2. Pre-diabetes A1c today-normal today - Lipid panel - COMPLETE METABOLIC PANEL WITH GFR - CBC with Differential - TSH 3. Allergy, sequela Long term-no inhalers mucinex -600mg  BID flonase-2 sprays to both nostrils  4. Screening for osteoporosis - DG Bone Density; Future Suggested when pt goes for mamm-schedule for DEXA-none previously 5. Elevated systolic blood pressure reading without diagnosis of hypertension Pt with elevated bp at GYN office in the past-d/w need to start medications-pt would like to discuss with daughter who is an LPN. No CP/SOB/palpitations Outpatient Encounter Medications as of 06/22/2019  Medication Sig  . BESIVANCE 0.6 % SUSP Apply 1 drop to eye 3 (three) times daily.  . Fish Oil-Cholecalciferol (FISH OIL + D3) 1000-1000 MG-UNIT CAPS Take 1 capsule by mouth daily.   . Multiple Vitamins-Minerals (CENTRAL-VITE FOR SENIORS PO) Take 1 tablet by mouth daily.   Marland Kitchen nystatin  cream (MYCOSTATIN) Apply 1 application topically 2 (two) times daily. (Patient not taking: Reported on 06/08/2019)  . nystatin cream (MYCOSTATIN) APPLY TO AFFECTED AREAS TWICE DAILY. (Patient not taking: Reported on 06/08/2019)   No facility-administered encounter medications on file as of 06/22/2019.     Follow-up: 6 months  Erik Burkett Hannah Beat, MD

## 2019-06-30 ENCOUNTER — Ambulatory Visit (HOSPITAL_COMMUNITY)
Admission: RE | Admit: 2019-06-30 | Discharge: 2019-06-30 | Disposition: A | Discharge status: Home / Self Care | Payer: Medicare HMO | Source: Ambulatory Visit | Attending: Obstetrics & Gynecology | Admitting: Obstetrics & Gynecology

## 2019-06-30 ENCOUNTER — Other Ambulatory Visit: Payer: Self-pay

## 2019-06-30 DIAGNOSIS — IMO0002 Reserved for concepts with insufficient information to code with codable children: Secondary | ICD-10-CM

## 2019-06-30 DIAGNOSIS — N6489 Other specified disorders of breast: Secondary | ICD-10-CM | POA: Insufficient documentation

## 2019-06-30 DIAGNOSIS — N6321 Unspecified lump in the left breast, upper outer quadrant: Secondary | ICD-10-CM | POA: Diagnosis not present

## 2019-06-30 DIAGNOSIS — N6310 Unspecified lump in the right breast, unspecified quadrant: Secondary | ICD-10-CM | POA: Diagnosis not present

## 2019-06-30 DIAGNOSIS — R922 Inconclusive mammogram: Secondary | ICD-10-CM | POA: Diagnosis not present

## 2019-06-30 DIAGNOSIS — N6320 Unspecified lump in the left breast, unspecified quadrant: Secondary | ICD-10-CM | POA: Diagnosis present

## 2019-06-30 DIAGNOSIS — N6311 Unspecified lump in the right breast, upper outer quadrant: Secondary | ICD-10-CM | POA: Diagnosis not present

## 2019-07-06 ENCOUNTER — Other Ambulatory Visit (HOSPITAL_COMMUNITY): Payer: Self-pay | Admitting: Obstetrics & Gynecology

## 2019-07-06 DIAGNOSIS — N6320 Unspecified lump in the left breast, unspecified quadrant: Secondary | ICD-10-CM

## 2019-07-06 DIAGNOSIS — N6310 Unspecified lump in the right breast, unspecified quadrant: Secondary | ICD-10-CM

## 2019-07-14 ENCOUNTER — Ambulatory Visit (HOSPITAL_COMMUNITY): Admission: RE | Admit: 2019-07-14 | Payer: Medicare HMO | Source: Ambulatory Visit

## 2019-12-21 ENCOUNTER — Encounter: Payer: Self-pay | Admitting: Family Medicine

## 2019-12-21 ENCOUNTER — Other Ambulatory Visit: Payer: Self-pay

## 2019-12-21 ENCOUNTER — Ambulatory Visit (INDEPENDENT_AMBULATORY_CARE_PROVIDER_SITE_OTHER): Payer: Medicare HMO | Admitting: Family Medicine

## 2019-12-21 VITALS — BP 174/96 | HR 64 | Temp 95.2°F | Wt 196.8 lb

## 2019-12-21 DIAGNOSIS — R030 Elevated blood-pressure reading, without diagnosis of hypertension: Secondary | ICD-10-CM | POA: Diagnosis not present

## 2019-12-21 DIAGNOSIS — R7303 Prediabetes: Secondary | ICD-10-CM

## 2019-12-21 NOTE — Progress Notes (Signed)
Established Patient Office Visit  Subjective:  Patient ID: Natasha Willis, female    DOB: 09-Jul-1941  Age: 79 y.o. MRN: [id number]  CC:  Chief Complaint  Patient presents with  . Follow-up    recheck of b/p    HPI AFIYA FERREBEE presents for elevated blood pressure-checking blood pressure at home-pt does not want medications Elevate glucose-pt watching glucose intake Asthma-no concerns Diverticulitis-no flares Pt with no recent mammogram/DEXA Past Medical History:  Diagnosis Date  . Asthma   . Diverticulitis     Past Surgical History:  Procedure Laterality Date  . ABDOMINAL HYSTERECTOMY    . ANTERIOR AND POSTERIOR REPAIR    . APPENDECTOMY    . CATARACT EXTRACTION W/PHACO Right 08/19/2014   Procedure: CATARACT EXTRACTION PHACO AND INTRAOCULAR LENS PLACEMENT RIGHT EYE;  Surgeon: Tonny Branch, MD;  Location: [facility];  Service: Ophthalmology;  Laterality: Right;  CDE:7.60  . CATARACT EXTRACTION W/PHACO Left 08/30/2014   Procedure: CATARACT EXTRACTION PHACO AND INTRAOCULAR LENS PLACEMENT (IOC);  Surgeon: Tonny Branch, MD;  Location: [facility];  Service: Ophthalmology;  Laterality: Left;  CDE:5.49  . CHOLECYSTECTOMY    . TUBAL LIGATION      Family History  Problem Relation Age of Onset  . Diabetes Brother   . Heart disease Brother   . Diabetes Brother   . Heart disease Brother     Social History   Socioeconomic History  . Marital status: Divorced    Spouse name: Not on file  . Number of children: 5  . Years of education: Not on file  . Highest education level: Not on file  Occupational History  . Not on file  Tobacco Use  . Smoking status: Never Smoker  . Smokeless tobacco: Never Used  Substance and Sexual Activity  . Alcohol use: No  . Drug use: No  . Sexual activity: Not Currently    Birth control/protection: Surgical  Other Topics Concern  . Not on file  Social History Narrative  . Not on file   Social Determinants of Health   Financial Resource  Strain:   . Difficulty of Paying Living Expenses:   Food Insecurity:   . Worried About Charity fundraiser in the Last Year:   . Arboriculturist in the Last Year:   Transportation Needs:   . Film/video editor (Medical):   Marland Kitchen Lack of Transportation (Non-Medical):   Physical Activity:   . Days of Exercise per Week:   . Minutes of Exercise per Session:   Stress:   . Feeling of Stress :   Social Connections:   . Frequency of Communication with Friends and Family:   . Frequency of Social Gatherings with Friends and Family:   . Attends Religious Services:   . Active Member of Clubs or Organizations:   . Attends Archivist Meetings:   Marland Kitchen Marital Status:   Intimate Partner Violence:   . Fear of Current or Ex-Partner:   . Emotionally Abused:   Marland Kitchen Physically Abused:   . Sexually Abused:     Outpatient Medications Prior to Visit  Medication Sig Dispense Refill  . carboxymethylcellulose 1 % ophthalmic solution 1 drop 3 (three) times daily.    . Fish Oil-Cholecalciferol (FISH OIL + D3) 1000-1000 MG-UNIT CAPS Take 1 capsule by mouth daily.     . Multiple Vitamins-Minerals (CENTRAL-VITE FOR SENIORS PO) Take 1 tablet by mouth daily.     Marland Kitchen nystatin cream (MYCOSTATIN) APPLY TO AFFECTED AREAS TWICE  DAILY. 30 g 11  . BESIVANCE 0.6 % SUSP Apply 1 drop to eye 3 (three) times daily.  0  . nystatin cream (MYCOSTATIN) Apply 1 application topically 2 (two) times daily. (Patient not taking: Reported on 06/08/2019) 30 g 11   No facility-administered medications prior to visit.    Allergies  Allergen Reactions  . Penicillins     ROS Review of Systems  Constitutional: Negative.   HENT: Negative.   Eyes:       Glasses-cataracts, readings glasses  Respiratory: Negative.   Cardiovascular: Positive for leg swelling.  Gastrointestinal: Negative.   Endocrine: Negative.   Genitourinary:       Sees GYN  Musculoskeletal: Negative.   Skin: Negative.   Allergic/Immunologic: Negative.    Neurological: Negative.   Hematological: Negative.   Psychiatric/Behavioral: Negative.       Objective:    Physical Exam  Constitutional: She is oriented to person, place, and time. She appears well-developed and well-nourished.  HENT:  Head: Normocephalic and atraumatic.  Eyes: Conjunctivae are normal.  Cardiovascular: Normal rate and regular rhythm.  Pulmonary/Chest: Effort normal and breath sounds normal.  Musculoskeletal:        General: Normal range of motion.     Cervical back: Normal range of motion and neck supple.  Neurological: She is oriented to person, place, and time.  Psychiatric: She has a normal mood and affect. Her behavior is normal.    BP (!) 174/96 (BP Location: Right Arm, Patient Position: Sitting)   Pulse 64   Temp (!) 95.2 F (35.1 C) (Temporal)   Wt 196 lb 12.8 oz (89.3 kg)   SpO2 95%   BMI 30.37 kg/m  Wt Readings from Last 3 Encounters:  12/21/19 196 lb 12.8 oz (89.3 kg)  06/22/19 193 lb (87.5 kg)  06/08/19 194 lb (88 kg)     Health Maintenance Due  Topic Date Due  . TETANUS/TDAP  Never done  . DEXA SCAN  Never done  . PNA vac Low Risk Adult (1 of 2 - PCV13) Never done    Lab Results  Component Value Date   WBC 6.1 08/17/2014   HGB 15.8 (H) 08/17/2014   HCT 46.2 (H) 08/17/2014   MCV 93.0 08/17/2014   PLT 223 08/17/2014   Lab Results  Component Value Date   NA 142 08/17/2014   K 4.5 08/17/2014   CO2 29 08/17/2014   GLUCOSE 117 (H) 08/17/2014   BUN 13 08/17/2014   CREATININE 0.59 08/17/2014   CALCIUM 9.7 08/17/2014   ANIONGAP 11 08/17/2014   Lab Results  Component Value Date   HGBA1C 5.6 06/22/2019      Assessment & Plan:  1. Pre-diabetes No meds - TSH - COMPLETE METABOLIC PANEL WITH GFR - CBC w/Diff/Platelet - Hemoglobin A1c - Lipid panel  2. Elevated systolic blood pressure reading without diagnosis of hypertension No meds - TSH - COMPLETE METABOLIC PANEL WITH GFR - CBC w/Diff/Platelet - Hemoglobin A1c -  Lipid panel Follow-up:  6 months, suggested mammograms/DEXA/fasting labwork   Giovanie Lefebre Mat Carne, MD

## 2019-12-21 NOTE — Patient Instructions (Signed)
Fasting lab work

## 2019-12-28 ENCOUNTER — Other Ambulatory Visit: Payer: Self-pay | Admitting: Emergency Medicine

## 2019-12-28 DIAGNOSIS — Z139 Encounter for screening, unspecified: Secondary | ICD-10-CM | POA: Diagnosis not present

## 2019-12-28 DIAGNOSIS — R030 Elevated blood-pressure reading, without diagnosis of hypertension: Secondary | ICD-10-CM

## 2019-12-28 DIAGNOSIS — R7303 Prediabetes: Secondary | ICD-10-CM | POA: Diagnosis not present

## 2019-12-28 DIAGNOSIS — I10 Essential (primary) hypertension: Secondary | ICD-10-CM | POA: Diagnosis not present

## 2019-12-29 LAB — CBC WITH DIFFERENTIAL/PLATELET
Absolute Monocytes: 564 cells/uL (ref 200–950)
Basophils Absolute: 60 cells/uL (ref 0–200)
Basophils Relative: 1.5 %
Eosinophils Absolute: 188 cells/uL (ref 15–500)
Eosinophils Relative: 4.7 %
HCT: 45.3 % — ABNORMAL HIGH (ref 35.0–45.0)
Hemoglobin: 15.1 g/dL (ref 11.7–15.5)
Lymphs Abs: 952 cells/uL (ref 850–3900)
MCH: 30.5 pg (ref 27.0–33.0)
MCHC: 33.3 g/dL (ref 32.0–36.0)
MCV: 91.5 fL (ref 80.0–100.0)
MPV: 10.3 fL (ref 7.5–12.5)
Monocytes Relative: 14.1 %
Neutro Abs: 2236 cells/uL (ref 1500–7800)
Neutrophils Relative %: 55.9 %
Platelets: 205 10*3/uL (ref 140–400)
RBC: 4.95 10*6/uL (ref 3.80–5.10)
RDW: 13.2 % (ref 11.0–15.0)
Total Lymphocyte: 23.8 %
WBC: 4 10*3/uL (ref 3.8–10.8)

## 2019-12-29 LAB — COMPLETE METABOLIC PANEL WITH GFR
AG Ratio: 1.7 (calc) (ref 1.0–2.5)
ALT: 12 U/L (ref 6–29)
AST: 14 U/L (ref 10–35)
Albumin: 3.8 g/dL (ref 3.6–5.1)
Alkaline phosphatase (APISO): 74 U/L (ref 37–153)
BUN/Creatinine Ratio: 25 (calc) — ABNORMAL HIGH (ref 6–22)
BUN: 12 mg/dL (ref 7–25)
CO2: 28 mmol/L (ref 20–32)
Calcium: 9.6 mg/dL (ref 8.6–10.4)
Chloride: 105 mmol/L (ref 98–110)
Creat: 0.48 mg/dL — ABNORMAL LOW (ref 0.60–0.93)
GFR, Est African American: 110 mL/min/{1.73_m2} (ref >=60)
GFR, Est Non African American: 95 mL/min/{1.73_m2} (ref >=60)
Globulin: 2.2 g/dL (calc) (ref 1.9–3.7)
Glucose, Bld: 104 mg/dL — ABNORMAL HIGH (ref 65–99)
Potassium: 4.2 mmol/L (ref 3.5–5.3)
Sodium: 139 mmol/L (ref 135–146)
Total Bilirubin: 1.1 mg/dL (ref 0.2–1.2)
Total Protein: 6 g/dL — ABNORMAL LOW (ref 6.1–8.1)

## 2019-12-29 LAB — LIPID PANEL
Cholesterol: 177 mg/dL (ref <200)
HDL: 50 mg/dL (ref >=50)
LDL Cholesterol (Calc): 107 mg/dL (calc) — ABNORMAL HIGH
Non-HDL Cholesterol (Calc): 127 mg/dL (calc) (ref <130)
Total CHOL/HDL Ratio: 3.5 (calc) (ref <5.0)
Triglycerides: 108 mg/dL (ref <150)

## 2019-12-29 LAB — HEMOGLOBIN A1C
Hgb A1c MFr Bld: 5.7 % of total Hgb — ABNORMAL HIGH (ref <5.7)
Mean Plasma Glucose: 117 (calc)
eAG (mmol/L): 6.5 (calc)

## 2019-12-29 LAB — TSH: TSH: 5.92 mIU/L — ABNORMAL HIGH (ref 0.40–4.50)

## 2020-01-04 ENCOUNTER — Other Ambulatory Visit: Payer: Self-pay

## 2020-01-04 ENCOUNTER — Ambulatory Visit (INDEPENDENT_AMBULATORY_CARE_PROVIDER_SITE_OTHER): Payer: Medicare HMO | Admitting: Family Medicine

## 2020-01-04 ENCOUNTER — Encounter: Payer: Self-pay | Admitting: Family Medicine

## 2020-01-04 VITALS — BP 168/84 | HR 74 | Temp 97.4°F | Ht 63.5 in | Wt 198.6 lb

## 2020-01-04 DIAGNOSIS — R7303 Prediabetes: Secondary | ICD-10-CM

## 2020-01-04 DIAGNOSIS — I10 Essential (primary) hypertension: Secondary | ICD-10-CM

## 2020-01-04 DIAGNOSIS — E039 Hypothyroidism, unspecified: Secondary | ICD-10-CM | POA: Diagnosis not present

## 2020-01-04 MED ORDER — LISINOPRIL 10 MG PO TABS: 10.0000 mg | ORAL_TABLET | Freq: Every day | ORAL | 1 refills | Status: DC

## 2020-01-04 MED ORDER — LEVOTHYROXINE SODIUM 25 MCG PO TABS: 25.0000 ug | ORAL_TABLET | Freq: Every day | ORAL | 1 refills | Status: DC

## 2020-01-04 NOTE — Patient Instructions (Addendum)
Start lisinopril daily Start synthroid daily-DO NOT TAKE MEDICATION WITH VITAMINS and MEDICATION HAVE BLOOD WORK COMPLETED 5/25 Check blood pressure at home  If you have lab work done today you will be contacted with your lab results within the next 2 weeks.  If you have not heard from [place] then please contact us. The fastest way to get your results is to register for My Chart.   IF you received an x-ray today, you will receive an invoice from [facility]. Please contact [facility] at [phone] with questions or concerns regarding your invoice.   IF you received labwork today, you will receive an invoice from [facility]. Please contact [facility] at [phone] with questions or concerns regarding your invoice.   Our billing staff will not be able to assist you with questions regarding bills from these companies.  You will be contacted with the lab results as soon as they are available. The fastest way to get your results is to activate your My Chart account. Instructions are located on the last page of this paperwork. If you have not heard from [place] regarding the results in 2 weeks, please contact this office.

## 2020-01-04 NOTE — Progress Notes (Signed)
Established Patient Office Visit  Subjective:  Patient ID: Natasha Willis, female    DOB: 10/16/1940  Age: 79 y.o. MRN: [id number]  CC:  Chief Complaint  Patient presents with  . Hypothyroidism    here to discuss tx option for elevated thyroid    HPI Natasha Willis presents for hypothyroid-no history, TSH Elevated blood pressure-no medication in the past-no headaches, no visual changes  Past Medical History:  Diagnosis Date  . Asthma   . Diverticulitis     Past Surgical History:  Procedure Laterality Date  . ABDOMINAL HYSTERECTOMY    . ANTERIOR AND POSTERIOR REPAIR    . APPENDECTOMY    . CATARACT EXTRACTION W/PHACO Right 08/19/2014   Procedure: CATARACT EXTRACTION PHACO AND INTRAOCULAR LENS PLACEMENT RIGHT EYE;  Surgeon: Tonny Branch, MD;  Location: [facility];  Service: Ophthalmology;  Laterality: Right;  CDE:7.60  . CATARACT EXTRACTION W/PHACO Left 08/30/2014   Procedure: CATARACT EXTRACTION PHACO AND INTRAOCULAR LENS PLACEMENT (IOC);  Surgeon: Tonny Branch, MD;  Location: [facility];  Service: Ophthalmology;  Laterality: Left;  CDE:5.49  . CHOLECYSTECTOMY    . TUBAL LIGATION      Family History  Problem Relation Age of Onset  . Diabetes Brother   . Heart disease Brother   . Diabetes Brother   . Heart disease Brother     Social History   Socioeconomic History  . Marital status: Divorced    Spouse name: Not on file  . Number of children: 5  . Years of education: Not on file  . Highest education level: Not on file  Occupational History  . Not on file  Tobacco Use  . Smoking status: Never Smoker  . Smokeless tobacco: Never Used  Substance and Sexual Activity  . Alcohol use: No  . Drug use: No  . Sexual activity: Not Currently    Birth control/protection: Surgical  Other Topics Concern  . Not on file  Social History Narrative  . Not on file   Social Determinants of Health   Financial Resource Strain:   . Difficulty of Paying Living Expenses:   Food  Insecurity:   . Worried About Charity fundraiser in the Last Year:   . Arboriculturist in the Last Year:   Transportation Needs:   . Film/video editor (Medical):   Marland Kitchen Lack of Transportation (Non-Medical):   Physical Activity:   . Days of Exercise per Week:   . Minutes of Exercise per Session:   Stress:   . Feeling of Stress :   Social Connections:   . Frequency of Communication with Friends and Family:   . Frequency of Social Gatherings with Friends and Family:   . Attends Religious Services:   . Active Member of Clubs or Organizations:   . Attends Archivist Meetings:   Marland Kitchen Marital Status:   Intimate Partner Violence:   . Fear of Current or Ex-Partner:   . Emotionally Abused:   Marland Kitchen Physically Abused:   . Sexually Abused:     Outpatient Medications Prior to Visit  Medication Sig Dispense Refill  . carboxymethylcellulose 1 % ophthalmic solution 1 drop 3 (three) times daily.    . Fish Oil-Cholecalciferol (FISH OIL + D3) 1000-1000 MG-UNIT CAPS Take 1 capsule by mouth daily.     . Multiple Vitamins-Minerals (CENTRAL-VITE FOR SENIORS PO) Take 1 tablet by mouth daily.      No facility-administered medications prior to visit.    Allergies  Allergen Reactions  .  Penicillins     ROS Review of Systems  Constitutional: Negative.   Respiratory: Negative.   Cardiovascular: Negative.   Gastrointestinal: Negative.   Musculoskeletal: Negative.   Neurological: Negative.   Hematological: Negative.   Psychiatric/Behavioral: Negative.       Objective:    Physical Exam  Constitutional: She appears well-developed and well-nourished.  HENT:  Head: Normocephalic and atraumatic.  Cardiovascular: Normal rate, regular rhythm, normal heart sounds and intact distal pulses.  Pulmonary/Chest: Effort normal and breath sounds normal.  Musculoskeletal:        General: Edema present.  Psychiatric: She has a normal mood and affect. Her behavior is normal.    BP (!) 168/84 (BP  Location: Right Arm, Patient Position: Sitting, Cuff Size: Large)   Pulse 74   Temp (!) 97.4 F (36.3 C) (Temporal)   Ht 5' 3.5" (1.613 m)   Wt 198 lb 9.6 oz (90.1 kg)   SpO2 94%   BMI 34.63 kg/m  Wt Readings from Last 3 Encounters:  01/04/20 198 lb 9.6 oz (90.1 kg)  12/21/19 196 lb 12.8 oz (89.3 kg)  06/22/19 193 lb (87.5 kg)     Health Maintenance Due  Topic Date Due  . COVID-19 Vaccine (1) Never done  . TETANUS/TDAP  Never done  . DEXA SCAN  Never done  . PNA vac Low Risk Adult (1 of 2 - PCV13) Never done    Lab Results  Component Value Date   TSH 5.92 (H) 12/28/2019   Lab Results  Component Value Date   WBC 4.0 12/28/2019   HGB 15.1 12/28/2019   HCT 45.3 (H) 12/28/2019   MCV 91.5 12/28/2019   PLT 205 12/28/2019   Lab Results  Component Value Date   NA 139 12/28/2019   K 4.2 12/28/2019   CO2 28 12/28/2019   GLUCOSE 104 (H) 12/28/2019   BUN 12 12/28/2019   CREATININE 0.48 (L) 12/28/2019   BILITOT 1.1 12/28/2019   AST 14 12/28/2019   ALT 12 12/28/2019   PROT 6.0 (L) 12/28/2019   CALCIUM 9.6 12/28/2019   ANIONGAP 11 08/17/2014   Lab Results  Component Value Date   CHOL 177 12/28/2019   Lab Results  Component Value Date   HDL 50 12/28/2019   Lab Results  Component Value Date   LDLCALC 107 (H) 12/28/2019   Lab Results  Component Value Date   TRIG 108 12/28/2019   Lab Results  Component Value Date   CHOLHDL 3.5 12/28/2019   Lab Results  Component Value Date   HGBA1C 5.7 (H) 12/28/2019      Assessment & Plan:  1. Pre-diabetes A1c 5.7% Diet modification 2. Essential hypertension Start lisinopril 10mg  daily-recheck BMP in 1 month  3. Hypothyroidism, unspecified type Start synthroid 61mcg-rx Follow-up: TSH in 6 weeks, clinic recheck   LISA 32m, MD

## 2020-02-08 DIAGNOSIS — E039 Hypothyroidism, unspecified: Secondary | ICD-10-CM | POA: Diagnosis not present

## 2020-02-08 DIAGNOSIS — I10 Essential (primary) hypertension: Secondary | ICD-10-CM | POA: Diagnosis not present

## 2020-02-09 ENCOUNTER — Ambulatory Visit (INDEPENDENT_AMBULATORY_CARE_PROVIDER_SITE_OTHER): Payer: Medicare HMO | Admitting: Family Medicine

## 2020-02-09 ENCOUNTER — Encounter: Payer: Self-pay | Admitting: Family Medicine

## 2020-02-09 ENCOUNTER — Other Ambulatory Visit: Payer: Self-pay

## 2020-02-09 VITALS — BP 131/80 | HR 74 | Temp 97.8°F | Wt 196.5 lb

## 2020-02-09 DIAGNOSIS — I10 Essential (primary) hypertension: Secondary | ICD-10-CM | POA: Diagnosis not present

## 2020-02-09 DIAGNOSIS — R7303 Prediabetes: Secondary | ICD-10-CM

## 2020-02-09 DIAGNOSIS — E039 Hypothyroidism, unspecified: Secondary | ICD-10-CM

## 2020-02-09 LAB — BASIC METABOLIC PANEL
BUN: 15 mg/dL (ref 7–25)
CO2: 29 mmol/L (ref 20–32)
Calcium: 9.9 mg/dL (ref 8.6–10.4)
Chloride: 108 mmol/L (ref 98–110)
Creat: 0.61 mg/dL (ref 0.60–0.93)
Glucose, Bld: 103 mg/dL (ref 65–139)
Potassium: 4.3 mmol/L (ref 3.5–5.3)
Sodium: 142 mmol/L (ref 135–146)

## 2020-02-09 LAB — TSH: TSH: 4.86 mIU/L — ABNORMAL HIGH (ref 0.40–4.50)

## 2020-02-09 MED ORDER — LEVOTHYROXINE SODIUM 50 MCG PO TABS: 50.0000 ug | ORAL_TABLET | Freq: Every day | ORAL | 3 refills | Status: DC

## 2020-02-09 NOTE — Patient Instructions (Signed)
Continue taking zestril daily Take synthroid daily(increase dose) Recheck TSH in 6 weeks to make sure the right dose Continue checking blood pressure every morning FIND A NEW PRIMARY CARE PHYSICIAN

## 2020-02-09 NOTE — Progress Notes (Signed)
Established Patient Office Visit  Subjective:  Patient ID: Natasha Willis, female    DOB: Jan 22, 1941  Age: 79 y.o. MRN: [id number]  CC:  Chief Complaint  Patient presents with  . Follow-up    no concerns    HPI Natasha Willis presents for elevated TSH-taking synthroid daily HTN-readings below 130/80 at home on monitor-improved symptoms per pt-no more ringing in the ears  Past Medical History:  Diagnosis Date  . Asthma   . Diverticulitis     Past Surgical History:  Procedure Laterality Date  . ABDOMINAL HYSTERECTOMY    . ANTERIOR AND POSTERIOR REPAIR    . APPENDECTOMY    . CATARACT EXTRACTION W/PHACO Right 08/19/2014   Procedure: CATARACT EXTRACTION PHACO AND INTRAOCULAR LENS PLACEMENT RIGHT EYE;  Surgeon: Gemma Payor, MD;  Location: [facility];  Service: Ophthalmology;  Laterality: Right;  CDE:7.60  . CATARACT EXTRACTION W/PHACO Left 08/30/2014   Procedure: CATARACT EXTRACTION PHACO AND INTRAOCULAR LENS PLACEMENT (IOC);  Surgeon: Gemma Payor, MD;  Location: [facility];  Service: Ophthalmology;  Laterality: Left;  CDE:5.49  . CHOLECYSTECTOMY    . TUBAL LIGATION      Family History  Problem Relation Age of Onset  . Diabetes Brother   . Heart disease Brother   . Diabetes Brother   . Heart disease Brother     Social History   Socioeconomic History  . Marital status: Divorced    Spouse name: Not on file  . Number of children: 5  . Years of education: Not on file  . Highest education level: Not on file  Occupational History  . Not on file  Tobacco Use  . Smoking status: Never Smoker  . Smokeless tobacco: Never Used  Substance and Sexual Activity  . Alcohol use: No  . Drug use: No  . Sexual activity: Not Currently    Birth control/protection: Surgical  Other Topics Concern  . Not on file  Social History Narrative  . Not on file   Social Determinants of Health   Financial Resource Strain:   . Difficulty of Paying Living Expenses:   Food Insecurity:    . Worried About Programme researcher, broadcasting/film/video in the Last Year:   . Barista in the Last Year:   Transportation Needs:   . Freight forwarder (Medical):   Marland Kitchen Lack of Transportation (Non-Medical):   Physical Activity:   . Days of Exercise per Week:   . Minutes of Exercise per Session:   Stress:   . Feeling of Stress :   Social Connections:   . Frequency of Communication with Friends and Family:   . Frequency of Social Gatherings with Friends and Family:   . Attends Religious Services:   . Active Member of Clubs or Organizations:   . Attends Banker Meetings:   Marland Kitchen Marital Status:   Intimate Partner Violence:   . Fear of Current or Ex-Partner:   . Emotionally Abused:   Marland Kitchen Physically Abused:   . Sexually Abused:     Outpatient Medications Prior to Visit  Medication Sig Dispense Refill  . carboxymethylcellulose 1 % ophthalmic solution 1 drop 3 (three) times daily.    . Fish Oil-Cholecalciferol (FISH OIL + D3) 1000-1000 MG-UNIT CAPS Take 1 capsule by mouth daily.     Marland Kitchen levothyroxine (SYNTHROID) 25 MCG tablet Take 1 tablet (25 mcg total) by mouth daily before breakfast. 30 tablet 1  . lisinopril (ZESTRIL) 10 MG tablet Take 1 tablet (10  mg total) by mouth daily. 30 tablet 1  . Multiple Vitamins-Minerals (CENTRAL-VITE FOR SENIORS PO) Take 1 tablet by mouth daily.      No facility-administered medications prior to visit.    Allergies  Allergen Reactions  . Penicillins     ROS Review of Systems  Constitutional: Negative.   HENT: Negative.   Respiratory: Negative.   Cardiovascular: Negative.   Gastrointestinal: Negative.   Endocrine:       Hypothyroid  Neurological: Negative for dizziness and headaches.  Psychiatric/Behavioral: Negative.       Objective:    Physical Exam  Constitutional: She is oriented to person, place, and time. She appears well-developed and well-nourished.  HENT:  Head: Normocephalic and atraumatic.  Eyes: Conjunctivae are normal.   Cardiovascular: Normal rate, regular rhythm and normal heart sounds.  Pulmonary/Chest: Effort normal and breath sounds normal.  Neurological: She is oriented to person, place, and time.  Psychiatric: She has a normal mood and affect. Her behavior is normal.    BP 131/80 (BP Location: Left Arm, Patient Position: Sitting)   Pulse 74   Temp 97.8 F (36.6 C) (Temporal)   Wt 196 lb 8 oz (89.1 kg)   SpO2 94%   BMI 34.26 kg/m  Wt Readings from Last 3 Encounters:  02/09/20 196 lb 8 oz (89.1 kg)  01/04/20 198 lb 9.6 oz (90.1 kg)  12/21/19 196 lb 12.8 oz (89.3 kg)     Health Maintenance Due  Topic Date Due  . DEXA SCAN  Never done    Lab Results  Component Value Date   TSH 4.86 (H) 02/08/2020   Lab Results  Component Value Date   WBC 4.0 12/28/2019   HGB 15.1 12/28/2019   HCT 45.3 (H) 12/28/2019   MCV 91.5 12/28/2019   PLT 205 12/28/2019   Lab Results  Component Value Date   NA 142 02/08/2020   K 4.3 02/08/2020   CO2 29 02/08/2020   GLUCOSE 103 02/08/2020   BUN 15 02/08/2020   CREATININE 0.61 02/08/2020   BILITOT 1.1 12/28/2019   AST 14 12/28/2019   ALT 12 12/28/2019   PROT 6.0 (L) 12/28/2019   CALCIUM 9.9 02/08/2020   ANIONGAP 11 08/17/2014   Lab Results  Component Value Date   CHOL 177 12/28/2019   Lab Results  Component Value Date   HDL 50 12/28/2019   Lab Results  Component Value Date   LDLCALC 107 (H) 12/28/2019   Lab Results  Component Value Date   TRIG 108 12/28/2019   Lab Results  Component Value Date   CHOLHDL 3.5 12/28/2019   Lab Results  Component Value Date   HGBA1C 5.7 (H) 12/28/2019      Assessment & Plan:  1. Pre-diabetes Watching carbs  2. Essential hypertension Continue zestril daily-bp well controlled, +FH 3. Hypothyroidism, unspecified type TSH-4.86-increase synthroid to 44mcg daily-recheck in 6 weeks Follow-up: Find new primary care provider  Christifer Chapdelaine Hannah Beat, MD

## 2020-02-26 DIAGNOSIS — E119 Type 2 diabetes mellitus without complications: Secondary | ICD-10-CM | POA: Diagnosis not present

## 2020-03-19 ENCOUNTER — Other Ambulatory Visit: Payer: Self-pay | Admitting: Family Medicine

## 2020-03-24 ENCOUNTER — Ambulatory Visit (INDEPENDENT_AMBULATORY_CARE_PROVIDER_SITE_OTHER): Payer: Medicare HMO | Admitting: Family Medicine

## 2020-03-24 ENCOUNTER — Encounter: Payer: Self-pay | Admitting: Family Medicine

## 2020-03-24 ENCOUNTER — Other Ambulatory Visit: Payer: Self-pay

## 2020-03-24 VITALS — BP 138/82 | HR 70 | Temp 97.2°F | Resp 16 | Ht 67.0 in | Wt 191.8 lb

## 2020-03-24 DIAGNOSIS — E039 Hypothyroidism, unspecified: Secondary | ICD-10-CM | POA: Diagnosis not present

## 2020-03-24 DIAGNOSIS — I10 Essential (primary) hypertension: Secondary | ICD-10-CM | POA: Diagnosis not present
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
## 2020-03-24 NOTE — Assessment & Plan Note (Signed)
Elevated blood pressure on the higher end.  I think she would benefit from being on something low-dose.  However she was taken off lisinopril earlier this year.  We will continue to review this and if kidney function changes or blood pressure remains high I might opt for something low-dose as long as she is not dizzy.  If not symptomatically could potentially just monitor for now.  Encouraged DASH diet and physical activity as tolerated and safe.

## 2020-03-24 NOTE — Patient Instructions (Signed)
I appreciate the opportunity to provide you with care for your health and wellness. Today we discussed: established care   Follow up: 3-4 months   Labs today  No referrals today  GREAT TO MEET YOU TODAY!   Please continue to practice social distancing to keep you, your family, and our community safe.  If you must go out, please wear a mask and practice good handwashing.  It was a pleasure to see you and I look forward to continuing to work together on your health and well-being. Please do not hesitate to call the office if you need care or have questions about your care.  Have a wonderful day and week. With Gratitude, Tereasa Coop, DNP, AGNP-BC

## 2020-03-24 NOTE — Assessment & Plan Note (Signed)
Needs to have updated labs.  Place today.  Will get in the next week.  Pending this will do any adjustments to medication as needed.  Reports no signs or symptoms of hypothyroidism today.

## 2020-03-29 ENCOUNTER — Telehealth: Payer: Self-pay | Admitting: Family Medicine

## 2020-03-29 NOTE — Telephone Encounter (Signed)
Attempted to call pt line was busy  will try again later

## 2020-03-29 NOTE — Telephone Encounter (Signed)
Patients daughter called on behalf of patient to ask about labs that are scheduled and when they should get labs done that were discussed in last appt. On 03/24/20. Per daughter can call pt back at [phone] to discuss.

## 2020-03-29 NOTE — Telephone Encounter (Signed)
LVM for pt to call the office.

## 2020-03-31 ENCOUNTER — Telehealth: Payer: Self-pay | Admitting: Family Medicine

## 2020-03-31 NOTE — Telephone Encounter (Signed)
Pt is wanting to know when and where she need to go for lab work

## 2020-04-01 NOTE — Telephone Encounter (Signed)
Pt notified lab orders were for [facility]

## 2020-04-01 NOTE — Telephone Encounter (Signed)
Spoke with pt today about labs

## 2020-04-04 DIAGNOSIS — E039 Hypothyroidism, unspecified: Secondary | ICD-10-CM | POA: Diagnosis not present

## 2020-04-04 LAB — TSH: TSH: 4.25 mIU/L (ref 0.40–4.50)

## 2020-06-30 ENCOUNTER — Other Ambulatory Visit: Payer: Self-pay

## 2020-06-30 ENCOUNTER — Ambulatory Visit (INDEPENDENT_AMBULATORY_CARE_PROVIDER_SITE_OTHER): Payer: Medicare HMO | Admitting: Family Medicine

## 2020-06-30 ENCOUNTER — Other Ambulatory Visit: Payer: Self-pay | Admitting: Family Medicine

## 2020-06-30 ENCOUNTER — Encounter: Payer: Self-pay | Admitting: Family Medicine

## 2020-06-30 VITALS — BP 132/74 | HR 82 | Ht 67.0 in | Wt 187.0 lb

## 2020-06-30 DIAGNOSIS — I10 Essential (primary) hypertension: Secondary | ICD-10-CM | POA: Diagnosis not present

## 2020-06-30 DIAGNOSIS — E039 Hypothyroidism, unspecified: Secondary | ICD-10-CM | POA: Diagnosis not present

## 2020-06-30 NOTE — Assessment & Plan Note (Signed)
Well controlled at home. BP range of 106-128/65-78. Encouraged DASH diet and exercise as safe and able.

## 2020-06-30 NOTE — Patient Instructions (Addendum)
  HAPPY FALL!  I appreciate the opportunity to provide you with care for your health and wellness. Today we discussed: thyroid and BP  Follow up: Feb   Labs-today at [facility] or [facility]  No referrals today  Great BP readings at home! You do not need to take it anymore, unless you are feeling bad or want to continue.  Good job on weight loss, 5 pounds!  Have a great Holiday Season :)  Please continue to practice social distancing to keep you, your family, and our community safe.  If you must go out, please wear a mask and practice good handwashing.  It was a pleasure to see you and I look forward to continuing to work together on your health and well-being. Please do not hesitate to call the office if you need care or have questions about your care.  Have a wonderful day and week. With Gratitude, Tereasa Coop, DNP, AGNP-BC

## 2020-06-30 NOTE — Progress Notes (Signed)
Subjective:  Patient ID: Natasha Willis, female    DOB: 11/17/1940  Age: 79 y.o. MRN: [id number]  CC:  Chief Complaint  Patient presents with  . Follow-up    following up on her thyroid and blood pressure      HPI  HPI   Natasha Willis is a 79 year old female patient of mine.  She presents today for follow-up on blood pressure thyroid. Her thyroid had come out of range earlier in the year we did a recheck in July and it was back in range.  She reports that she is taking her medication well and without any issue or concern.  She is willing to have updated labs to make sure that it has stayed in range.  She denies having any signs or symptoms of hypothyroidism.  She has lost 5 pounds of weight.  She reports not changing much in her diet though.  She denies having sleep issues or trouble.  She denies any trouble chewing or swallowing or appetite changes.  She denies having any trouble going to the bathroom making water or urine no blood in urine or stool.  She denies having any memory issues.  She denies having any fall issues.  She denies any skin, hearing or vision changes.  She brings in her blood pressure readings which are in the range of 106-128/65-78 over the last 3 months.  She has been very consistent in taking them and they have been very consistent in their range.  She does have higher elevations when she is in the office.  But she reports that she just thinks that she has the white coat syndrome and gets little anxious when she comes into the doctor's office.  She denies having any chest pain, leg swelling, headaches, dizziness, palpitations, new cough, shortness of breath or any other signs or symptoms of possible elevated blood pressure.  She declines having any vaccines secondary to having reactions in the past.  Therefore she declined a flu vaccine today in the office.  Today patient denies signs and symptoms of COVID 19 infection including fever, chills, cough, shortness of  breath, and headache. Past Medical, Surgical, Social History, Allergies, and Medications have been Reviewed.   Past Medical History:  Diagnosis Date  . Asthma   . Diverticulitis   . Hypertension   . Thyroid disease     Current Meds  Medication Sig  . carboxymethylcellulose 1 % ophthalmic solution 1 drop 3 (three) times daily.  . Fish Oil-Cholecalciferol (FISH OIL + D3) 1000-1000 MG-UNIT CAPS Take 1 capsule by mouth daily.   Marland Kitchen levothyroxine (SYNTHROID) 50 MCG tablet Take 1 tablet (50 mcg total) by mouth daily.  . Multiple Vitamins-Minerals (CENTRAL-VITE FOR SENIORS PO) Take 1 tablet by mouth daily.     ROS:  Review of Systems  Constitutional: Negative.   HENT: Negative.   Eyes: Negative.   Respiratory: Negative.   Cardiovascular: Negative.   Gastrointestinal: Negative.   Genitourinary: Negative.   Musculoskeletal: Negative.   Skin: Negative.   Neurological: Negative.   Endo/Heme/Allergies: Negative.   Psychiatric/Behavioral: Negative.      Objective:   Today's Vitals: BP 132/74 (BP Location: Right Arm, Patient Position: Sitting, Cuff Size: Normal)   Pulse 82   Ht 5\' 7"  (1.702 m)   Wt 187 lb (84.8 kg)   SpO2 96%   BMI 29.29 kg/m  Vitals with BMI 06/30/2020 03/24/2020 03/24/2020  Height 5\' 7"  - 5\' 7"   Weight 187 lbs - 191 lbs  13 oz  BMI 29.28 - 30.03  Systolic 132 138 938  Diastolic 74 82 84  Pulse 82 - 70     Physical Exam Vitals and nursing note reviewed.  Constitutional:      Appearance: Normal appearance. She is well-developed and well-groomed. She is obese.  HENT:     Head: Normocephalic and atraumatic.     Right Ear: External ear normal.     Left Ear: External ear normal.     Mouth/Throat:     Comments: Mask in place  Eyes:     General:        Right eye: No discharge.        Left eye: No discharge.     Conjunctiva/sclera: Conjunctivae normal.  Cardiovascular:     Rate and Rhythm: Normal rate and regular rhythm.     Pulses: Normal pulses.      Heart sounds: Normal heart sounds.  Pulmonary:     Effort: Pulmonary effort is normal.     Breath sounds: Normal breath sounds.  Musculoskeletal:        General: Normal range of motion.     Cervical back: Normal range of motion and neck supple.     Comments: Gilmer Mor present   Skin:    General: Skin is warm.  Neurological:     General: No focal deficit present.     Mental Status: She is alert and oriented to person, place, and time.  Psychiatric:        Attention and Perception: Attention normal.        Mood and Affect: Mood normal.        Speech: Speech normal.        Behavior: Behavior normal. Behavior is cooperative.        Thought Content: Thought content normal.        Cognition and Memory: Cognition normal.        Judgment: Judgment normal.    Assessment   1. Essential hypertension   2. Hypothyroidism, unspecified type     Tests ordered Orders Placed This Encounter  Procedures  . CBC  . Comprehensive metabolic panel  . TSH     Plan: Please see assessment and plan per problem list above.   No orders of the defined types were placed in this encounter.   Patient to follow-up in 10/26/2020  Note: This dictation was prepared with Dragon dictation along with smaller phrase technology. Similar sounding words can be transcribed inadequately or may not be corrected upon review. Any transcriptional errors that result from this process are unintentional.      Freddy Finner, NP

## 2020-06-30 NOTE — Assessment & Plan Note (Signed)
Denies have any signs or symptoms of hypothyroidism today.  We will get updated labs to make sure she is staying in range.  Overall doing well with medication and will continue if TSH is in normal range.

## 2020-07-01 LAB — COMPLETE METABOLIC PANEL WITH GFR
AG Ratio: 1.6 (calc) (ref 1.0–2.5)
ALT: 10 U/L (ref 6–29)
AST: 14 U/L (ref 10–35)
Albumin: 3.9 g/dL (ref 3.6–5.1)
Alkaline phosphatase (APISO): 83 U/L (ref 37–153)
BUN/Creatinine Ratio: 31 (calc) — ABNORMAL HIGH (ref 6–22)
BUN: 15 mg/dL (ref 7–25)
CO2: 28 mmol/L (ref 20–32)
Calcium: 10 mg/dL (ref 8.6–10.4)
Chloride: 105 mmol/L (ref 98–110)
Creat: 0.48 mg/dL — ABNORMAL LOW (ref 0.60–0.93)
GFR, Est African American: 109 mL/min/{1.73_m2} (ref >=60)
GFR, Est Non African American: 94 mL/min/{1.73_m2} (ref >=60)
Globulin: 2.4 g/dL (calc) (ref 1.9–3.7)
Glucose, Bld: 102 mg/dL — ABNORMAL HIGH (ref 65–99)
Potassium: 4.4 mmol/L (ref 3.5–5.3)
Sodium: 140 mmol/L (ref 135–146)
Total Bilirubin: 0.7 mg/dL (ref 0.2–1.2)
Total Protein: 6.3 g/dL (ref 6.1–8.1)

## 2020-07-01 LAB — CBC
HCT: 45.2 % — ABNORMAL HIGH (ref 35.0–45.0)
Hemoglobin: 15.1 g/dL (ref 11.7–15.5)
MCH: 30.7 pg (ref 27.0–33.0)
MCHC: 33.4 g/dL (ref 32.0–36.0)
MCV: 91.9 fL (ref 80.0–100.0)
MPV: 10.4 fL (ref 7.5–12.5)
Platelets: 266 10*3/uL (ref 140–400)
RBC: 4.92 10*6/uL (ref 3.80–5.10)
RDW: 13.1 % (ref 11.0–15.0)
WBC: 4.9 10*3/uL (ref 3.8–10.8)

## 2020-07-01 LAB — TSH: TSH: 2.91 mIU/L (ref 0.40–4.50)

## 2020-10-26 ENCOUNTER — Other Ambulatory Visit: Payer: Self-pay

## 2020-10-26 ENCOUNTER — Encounter: Payer: Self-pay | Admitting: Nurse Practitioner

## 2020-10-26 ENCOUNTER — Ambulatory Visit (INDEPENDENT_AMBULATORY_CARE_PROVIDER_SITE_OTHER): Payer: Medicare HMO | Admitting: Nurse Practitioner

## 2020-10-26 DIAGNOSIS — E039 Hypothyroidism, unspecified: Secondary | ICD-10-CM

## 2020-10-26 DIAGNOSIS — I10 Essential (primary) hypertension: Secondary | ICD-10-CM

## 2020-10-26 DIAGNOSIS — R7301 Impaired fasting glucose: Secondary | ICD-10-CM | POA: Diagnosis not present

## 2020-10-26 NOTE — Progress Notes (Signed)
Established Patient Office Visit  Subjective:  Patient ID: Natasha Willis, female    DOB: December 25, 1940  Age: 80 y.o. MRN: [id number]  CC:  Chief Complaint  Patient presents with  . Follow-up    HPI OPHIA SHAMOON presents for follow-up for HTN and thyroid disease. At her last OV on 06/30/20, her BP was well controlled, and her TSH was WNL.  She takes levothyroxine 50 mcg daily and is not currently on antihypertensives.  She states her SBP is usually in the 110s-120s. She states she is a little nervous this AM.  She states she has a BP medication previously, and that made her sick.  Past Medical History:  Diagnosis Date  . Asthma   . Diverticulitis   . Hypertension   . Thyroid disease     Past Surgical History:  Procedure Laterality Date  . ABDOMINAL HYSTERECTOMY    . ANTERIOR AND POSTERIOR REPAIR    . APPENDECTOMY    . CATARACT EXTRACTION W/PHACO Right 08/19/2014   Procedure: CATARACT EXTRACTION PHACO AND INTRAOCULAR LENS PLACEMENT RIGHT EYE;  Surgeon: Tonny Branch, MD;  Location: [facility];  Service: Ophthalmology;  Laterality: Right;  CDE:7.60  . CATARACT EXTRACTION W/PHACO Left 08/30/2014   Procedure: CATARACT EXTRACTION PHACO AND INTRAOCULAR LENS PLACEMENT (IOC);  Surgeon: Tonny Branch, MD;  Location: [facility];  Service: Ophthalmology;  Laterality: Left;  CDE:5.49  . CHOLECYSTECTOMY    . TUBAL LIGATION      Family History  Problem Relation Age of Onset  . Diabetes Brother   . Heart disease Brother   . Diabetes Brother   . Heart disease Brother     Social History   Socioeconomic History  . Marital status: Legally Separated    Spouse name: Not on file  . Number of children: 5  . Years of education: Not on file  . Highest education level: Not on file  Occupational History  . Not on file  Tobacco Use  . Smoking status: Never Smoker  . Smokeless tobacco: Never Used  Substance and Sexual Activity  . Alcohol use: No  . Drug use: No  . Sexual activity: Not  Currently    Birth control/protection: Surgical  Other Topics Concern  . Not on file  Social History Narrative   Lives alone   5 children, one lives 2 doors down.   [place], [employer], and 2 in [place]      Cat: Frankey Poot      Enjoys: reading-bible, christian books      Diet: strict diet (avoid sugar)   Caffeine: tea daily   Water: 6-8 cups       Wears seat belt- does not driving, daughter drives her    Oceanographer at home   No weapons   Social Determinants of Health   Financial Resource Strain: [place]   . Difficulty of Paying Living Expenses: Not hard at all  Food Insecurity: No Food Insecurity  . Worried About Charity fundraiser in the Last Year: Never true  . Ran Out of Food in the Last Year: Never true  Transportation Needs: No Transportation Needs  . Lack of Transportation (Medical): No  . Lack of Transportation (Non-Medical): No  Physical Activity: Inactive  . Days of Exercise per Week: 0 days  . Minutes of Exercise per Session: 0 min  Stress: No Stress Concern Present  . Feeling of Stress : Not at all  Social Connections: Moderately Isolated  . Frequency of Communication with  Friends and Family: More than three times a week  . Frequency of Social Gatherings with Friends and Family: More than three times a week  . Attends Religious Services: More than 4 times per year  . Active Member of Clubs or Organizations: No  . Attends Archivist Meetings: Never  . Marital Status: Widowed  Intimate Partner Violence: Not At Risk  . Fear of Current or Ex-Partner: No  . Emotionally Abused: No  . Physically Abused: No  . Sexually Abused: No    Outpatient Medications Prior to Visit  Medication Sig Dispense Refill  . carboxymethylcellulose 1 % ophthalmic solution 1 drop 3 (three) times daily.    . Fish Oil-Cholecalciferol (FISH OIL + D3) 1000-1000 MG-UNIT CAPS Take 1 capsule by mouth daily.     Marland Kitchen levothyroxine (SYNTHROID) 50 MCG tablet Take 1 tablet (50 mcg total)  by mouth daily. 90 tablet 3  . Multiple Vitamins-Minerals (CENTRAL-VITE FOR SENIORS PO) Take 1 tablet by mouth daily.      No facility-administered medications prior to visit.    Allergies  Allergen Reactions  . Penicillins     ROS Review of Systems  Constitutional: Negative.   Respiratory: Negative.   Cardiovascular: Negative.   Endocrine: Negative.   Psychiatric/Behavioral: Negative for self-injury and suicidal ideas. The patient is nervous/anxious.       Objective:    Physical Exam Constitutional:      Appearance: Normal appearance.  Cardiovascular:     Rate and Rhythm: Normal rate and regular rhythm.     Pulses: Normal pulses.     Heart sounds: Normal heart sounds.  Pulmonary:     Effort: Pulmonary effort is normal.     Breath sounds: Normal breath sounds.  Neurological:     General: No focal deficit present.     Mental Status: She is alert and oriented to person, place, and time.  Psychiatric:        Mood and Affect: Mood normal.        Behavior: Behavior normal.        Thought Content: Thought content normal.        Judgment: Judgment normal.     BP (!) 159/95 (BP Location: Right Arm, Patient Position: Sitting, Cuff Size: Normal)   Pulse 80   Temp (!) 97.3 F (36.3 C) (Temporal)   Ht _0  (1.702 m)   Wt 191 lb (86.6 kg)   SpO2 96%   BMI 29.91 kg/m  Wt Readings from Last 3 Encounters:  10/26/20 191 lb (86.6 kg)  06/30/20 187 lb (84.8 kg)  03/24/20 191 lb 12.8 oz (87 kg)     Health Maintenance Due  Topic Date Due  . Hepatitis C Screening  Never done  . URINE MICROALBUMIN  Never done  . DEXA SCAN  Never done    There are no preventive care reminders to display for this patient.  Lab Results  Component Value Date   TSH 2.91 06/30/2020   Lab Results  Component Value Date   WBC 4.9 06/30/2020   HGB 15.1 06/30/2020   HCT 45.2 (H) 06/30/2020   MCV 91.9 06/30/2020   PLT 266 06/30/2020   Lab Results  Component Value Date   NA 140  06/30/2020   K 4.4 06/30/2020   CO2 28 06/30/2020   GLUCOSE 102 (H) 06/30/2020   BUN 15 06/30/2020   CREATININE 0.48 (L) 06/30/2020   BILITOT 0.7 06/30/2020   AST 14 06/30/2020   ALT 10 06/30/2020  PROT 6.3 06/30/2020   CALCIUM 10.0 06/30/2020   ANIONGAP 11 08/17/2014   Lab Results  Component Value Date   CHOL 177 12/28/2019   Lab Results  Component Value Date   HDL 50 12/28/2019   Lab Results  Component Value Date   LDLCALC 107 (H) 12/28/2019   Lab Results  Component Value Date   TRIG 108 12/28/2019   Lab Results  Component Value Date   CHOLHDL 3.5 12/28/2019   Lab Results  Component Value Date   HGBA1C 5.7 (H) 12/28/2019      Assessment & Plan:   Problem List Items Addressed This Visit      Cardiovascular and Mediastinum   Essential hypertension    -BP well-controlled today -continue DASH diet      Relevant Orders   CBC with Differential/Platelet   CMP14+EGFR     Endocrine   Hypothyroidism    -TSH was WNL at last appointment -recheck TSH and T4 today      Relevant Orders   TSH + free T4   Impaired fasting glucose    -last glucose draws were elevated -will check A1c today per pt request      Relevant Orders   Hemoglobin A1c      No orders of the defined types were placed in this encounter.   Follow-up: Return in about 4 months (around 02/23/2021) for Thyroid and BP follow-up.    Noreene Larsson, NP

## 2020-10-26 NOTE — Assessment & Plan Note (Signed)
-  BP well-controlled today -continue DASH diet

## 2020-10-26 NOTE — Assessment & Plan Note (Signed)
-  last glucose draws were elevated -will check A1c today per pt request

## 2020-10-26 NOTE — Assessment & Plan Note (Signed)
-  TSH was WNL at last appointment -recheck TSH and T4 today

## 2020-10-26 NOTE — Patient Instructions (Signed)

## 2020-10-27 LAB — CMP14+EGFR
ALT: 17 IU/L (ref 0–32)
AST: 23 IU/L (ref 0–40)
Albumin/Globulin Ratio: 1.6 (ref 1.2–2.2)
Albumin: 4.1 g/dL (ref 3.7–4.7)
Alkaline Phosphatase: 103 IU/L (ref 44–121)
BUN/Creatinine Ratio: 22 (ref 12–28)
BUN: 15 mg/dL (ref 8–27)
Bilirubin Total: 0.5 mg/dL (ref 0.0–1.2)
CO2: 21 mmol/L (ref 20–29)
Calcium: 10 mg/dL (ref 8.7–10.3)
Chloride: 103 mmol/L (ref 96–106)
Creatinine, Ser: 0.69 mg/dL (ref 0.57–1.00)
GFR calc Af Amer: 96 mL/min/{1.73_m2} (ref >59)
GFR calc non Af Amer: 84 mL/min/{1.73_m2} (ref >59)
Globulin, Total: 2.6 g/dL (ref 1.5–4.5)
Glucose: 106 mg/dL — ABNORMAL HIGH (ref 65–99)
Potassium: 4.8 mmol/L (ref 3.5–5.2)
Sodium: 141 mmol/L (ref 134–144)
Total Protein: 6.7 g/dL (ref 6.0–8.5)

## 2020-10-27 LAB — CBC WITH DIFFERENTIAL/PLATELET
Basophils Absolute: 0.1 10*3/uL (ref 0.0–0.2)
Basos: 1 %
EOS (ABSOLUTE): 0.1 10*3/uL (ref 0.0–0.4)
Eos: 3 %
Hematocrit: 46.6 % (ref 34.0–46.6)
Hemoglobin: 15.5 g/dL (ref 11.1–15.9)
Immature Grans (Abs): 0 10*3/uL (ref 0.0–0.1)
Immature Granulocytes: 0 %
Lymphocytes Absolute: 1.1 10*3/uL (ref 0.7–3.1)
Lymphs: 23 %
MCH: 30.7 pg (ref 26.6–33.0)
MCHC: 33.3 g/dL (ref 31.5–35.7)
MCV: 92 fL (ref 79–97)
Monocytes Absolute: 0.6 10*3/uL (ref 0.1–0.9)
Monocytes: 12 %
Neutrophils Absolute: 2.8 10*3/uL (ref 1.4–7.0)
Neutrophils: 61 %
Platelets: 238 10*3/uL (ref 150–450)
RBC: 5.05 x10E6/uL (ref 3.77–5.28)
RDW: 13.3 % (ref 11.7–15.4)
WBC: 4.7 10*3/uL (ref 3.4–10.8)

## 2020-10-27 LAB — TSH+FREE T4
Free T4: 1.51 ng/dL (ref 0.82–1.77)
TSH: 3.48 u[IU]/mL (ref 0.450–4.500)

## 2020-10-27 LAB — HEMOGLOBIN A1C
Est. average glucose Bld gHb Est-mCnc: 126 mg/dL
Hgb A1c MFr Bld: 6 % — ABNORMAL HIGH (ref 4.8–5.6)

## 2020-10-27 NOTE — Progress Notes (Signed)
Her labs look great. A1c was 6.0, so no reason to add or change medications.

## 2021-02-27 DIAGNOSIS — H5200 Hypermetropia, unspecified eye: Secondary | ICD-10-CM | POA: Diagnosis not present

## 2021-02-27 DIAGNOSIS — Z0100 Encounter for examination of eyes and vision without abnormal findings: Secondary | ICD-10-CM | POA: Diagnosis not present

## 2021-02-27 LAB — HM DIABETES EYE EXAM

## 2021-02-28 ENCOUNTER — Encounter: Payer: Self-pay | Admitting: Nurse Practitioner

## 2021-02-28 ENCOUNTER — Other Ambulatory Visit: Payer: Self-pay

## 2021-02-28 ENCOUNTER — Ambulatory Visit (INDEPENDENT_AMBULATORY_CARE_PROVIDER_SITE_OTHER): Payer: Medicare HMO | Admitting: Nurse Practitioner

## 2021-02-28 VITALS — BP 146/80 | HR 80 | Temp 97.8°F | Resp 18 | Ht 67.0 in | Wt 187.0 lb

## 2021-02-28 DIAGNOSIS — M5432 Sciatica, left side: Secondary | ICD-10-CM | POA: Diagnosis not present

## 2021-02-28 DIAGNOSIS — R7301 Impaired fasting glucose: Secondary | ICD-10-CM

## 2021-02-28 DIAGNOSIS — M5430 Sciatica, unspecified side: Secondary | ICD-10-CM | POA: Insufficient documentation

## 2021-02-28 DIAGNOSIS — E039 Hypothyroidism, unspecified: Secondary | ICD-10-CM | POA: Diagnosis not present

## 2021-02-28 DIAGNOSIS — I10 Essential (primary) hypertension: Secondary | ICD-10-CM | POA: Diagnosis not present

## 2021-02-28 DIAGNOSIS — M5431 Sciatica, right side: Secondary | ICD-10-CM

## 2021-02-28 MED ORDER — TIZANIDINE HCL 4 MG PO TABS: 4.0000 mg | ORAL_TABLET | Freq: Four times a day (QID) | ORAL | 0 refills | Status: DC | PRN

## 2021-02-28 MED ORDER — PREDNISONE 10 MG PO TABS: ORAL_TABLET | ORAL | 0 refills | Status: AC

## 2021-02-28 NOTE — Assessment & Plan Note (Signed)
-  thyroid labs ordered today

## 2021-02-28 NOTE — Assessment & Plan Note (Signed)
-  Rx. Tizanidine -Rx. Prednisone -if no improvement in 1 week, will consider ortho consult

## 2021-02-28 NOTE — Assessment & Plan Note (Signed)
BP Readings from Last 3 Encounters:  02/28/21 (!) 146/80  10/26/20 (!) 159/95  06/30/20 132/74   -BP slightly elevated

## 2021-02-28 NOTE — Patient Instructions (Signed)
Please have fasting labs drawn today. 

## 2021-02-28 NOTE — Assessment & Plan Note (Signed)
-  will check A1c today

## 2021-02-28 NOTE — Progress Notes (Addendum)
Established Patient Office Visit  Subjective:  Patient ID: Natasha Willis, female    DOB: [birth year]  Age: 80 y.o. MRN: [id number]  CC:  Chief Complaint  Patient presents with   Hypertension   Hypothyroidism    HPI ROSHNI Willis presents for lab follow-up. At her last visit on 10/26/20, her BP was elevated, and she said she was nervous. She had used BP meds in the past, but stopped one (looks like lisinopril) because of side effects.  She has hx of hypothyroidism and takes levothyroxine 50 mcg daily. At home, her BP was 116/70s this AM at home.    Past Medical History:  Diagnosis Date   Asthma    Diverticulitis    Hypertension    Thyroid disease     Past Surgical History:  Procedure Laterality Date   ABDOMINAL HYSTERECTOMY     ANTERIOR AND POSTERIOR REPAIR     APPENDECTOMY     CATARACT EXTRACTION W/PHACO Right 08/19/2014   Procedure: CATARACT EXTRACTION PHACO AND INTRAOCULAR LENS PLACEMENT RIGHT EYE;  Surgeon: Tonny Branch, MD;  Location: [facility];  Service: Ophthalmology;  Laterality: Right;  CDE:7.60   CATARACT EXTRACTION W/PHACO Left 08/30/2014   Procedure: CATARACT EXTRACTION PHACO AND INTRAOCULAR LENS PLACEMENT (IOC);  Surgeon: Tonny Branch, MD;  Location: [facility];  Service: Ophthalmology;  Laterality: Left;  CDE:5.49   CHOLECYSTECTOMY     TUBAL LIGATION      Family History  Problem Relation Age of Onset   Diabetes Brother    Heart disease Brother    Diabetes Brother    Heart disease Brother     Social History   Socioeconomic History   Marital status: Legally Separated    Spouse name: Not on file   Number of children: 5   Years of education: Not on file   Highest education level: Not on file  Occupational History   Not on file  Tobacco Use   Smoking status: Never   Smokeless tobacco: Never  Substance and Sexual Activity   Alcohol use: No   Drug use: No   Sexual activity: Not Currently    Birth control/protection: Surgical  Other Topics Concern    Not on file  Social History Narrative   Lives alone   5 children, one lives 2 doors down.   [place], [employer], and 2 in [place]      Cat: Frankey Poot      Enjoys: reading-bible, christian books      Diet: strict diet (avoid sugar)   Caffeine: tea daily   Water: 6-8 cups       Wears seat belt- does not driving, daughter drives her    Oceanographer at home   No weapons   Social Determinants of Health   Financial Resource Strain: Low Risk    Difficulty of Paying Living Expenses: Not hard at all  Food Insecurity: No Food Insecurity   Worried About Charity fundraiser in the Last Year: Never true   Arboriculturist in the Last Year: Never true  Transportation Needs: No Transportation Needs   Lack of Transportation (Medical): No   Lack of Transportation (Non-Medical): No  Physical Activity: Inactive   Days of Exercise per Week: 0 days   Minutes of Exercise per Session: 0 min  Stress: No Stress Concern Present   Feeling of Stress : Not at all  Social Connections: Moderately Isolated   Frequency of Communication with Friends and Family: More than three times  a week   Frequency of Social Gatherings with Friends and Family: More than three times a week   Attends Religious Services: More than 4 times per year   Active Member of Clubs or Organizations: No   Attends Archivist Meetings: Never   Marital Status: Widowed  Human resources officer Violence: Not At Risk   Fear of Current or Ex-Partner: No   Emotionally Abused: No   Physically Abused: No   Sexually Abused: No    Outpatient Medications Prior to Visit  Medication Sig Dispense Refill   carboxymethylcellulose 1 % ophthalmic solution 1 drop 3 (three) times daily.     Fish Oil-Cholecalciferol (FISH OIL + D3) 1000-1000 MG-UNIT CAPS Take 1 capsule by mouth daily.      levothyroxine (SYNTHROID) 50 MCG tablet Take 1 tablet (50 mcg total) by mouth daily. 90 tablet 3   Multiple Vitamins-Minerals (CENTRAL-VITE FOR SENIORS PO) Take 1  tablet by mouth daily.      No facility-administered medications prior to visit.    Allergies  Allergen Reactions   Penicillins     ROS Review of Systems  Constitutional: Negative.   Respiratory: Negative.    Cardiovascular: Negative.   Musculoskeletal:  Positive for back pain and myalgias.       Low back pain that radiates down both of her legs; ongoing for several months  Psychiatric/Behavioral: Negative.       Objective:    Physical Exam Constitutional:      Appearance: Normal appearance.  Cardiovascular:     Rate and Rhythm: Normal rate and regular rhythm.     Pulses: Normal pulses.     Heart sounds: Normal heart sounds.  Pulmonary:     Effort: Pulmonary effort is normal.     Breath sounds: Normal breath sounds.  Musculoskeletal:        General: Tenderness present.     Comments: Walks with cane; positive straight leg raise bilaterally   Neurological:     Mental Status: She is alert.     Comments: Antalgic gait   Psychiatric:        Mood and Affect: Mood normal.        Behavior: Behavior normal.        Thought Content: Thought content normal.        Judgment: Judgment normal.    BP (!) 146/80 (BP Location: Right Arm, Patient Position: Sitting, Cuff Size: Large)   Pulse 80   Temp 97.8 F (36.6 C) (Oral)   Resp 18   Ht $R'5\' 7"'VO$  (1.702 m)   Wt 187 lb (84.8 kg)   SpO2 96%   BMI 29.29 kg/m  Wt Readings from Last 3 Encounters:  02/28/21 187 lb (84.8 kg)  10/26/20 191 lb (86.6 kg)  06/30/20 187 lb (84.8 kg)     Health Maintenance Due  Topic Date Due   URINE MICROALBUMIN  Never done   Hepatitis C Screening  Never done   DEXA SCAN  Never done    There are no preventive care reminders to display for this patient.  Lab Results  Component Value Date   TSH 3.480 10/26/2020   Lab Results  Component Value Date   WBC 4.7 10/26/2020   HGB 15.5 10/26/2020   HCT 46.6 10/26/2020   MCV 92 10/26/2020   PLT 238 10/26/2020   Lab Results  Component Value  Date   NA 141 10/26/2020   K 4.8 10/26/2020   CO2 21 10/26/2020   GLUCOSE 106 (H)  10/26/2020   BUN 15 10/26/2020   CREATININE 0.69 10/26/2020   BILITOT 0.5 10/26/2020   ALKPHOS 103 10/26/2020   AST 23 10/26/2020   ALT 17 10/26/2020   PROT 6.7 10/26/2020   ALBUMIN 4.1 10/26/2020   CALCIUM 10.0 10/26/2020   ANIONGAP 11 08/17/2014   Lab Results  Component Value Date   CHOL 177 12/28/2019   Lab Results  Component Value Date   HDL 50 12/28/2019   Lab Results  Component Value Date   LDLCALC 107 (H) 12/28/2019   Lab Results  Component Value Date   TRIG 108 12/28/2019   Lab Results  Component Value Date   CHOLHDL 3.5 12/28/2019   Lab Results  Component Value Date   HGBA1C 6.0 (H) 10/26/2020      Assessment & Plan:   Problem List Items Addressed This Visit       Cardiovascular and Mediastinum   Essential hypertension - Primary    BP Readings from Last 3 Encounters:  02/28/21 (!) 146/80  10/26/20 (!) 159/95  06/30/20 132/74  -BP slightly elevated       Relevant Orders   CBC with Differential/Platelet   CMP14+EGFR   Lipid Panel With LDL/HDL Ratio     Endocrine   Hypothyroidism    -thyroid labs ordered today       Relevant Orders   TSH + free T4   Impaired fasting glucose    -will check A1c today       Relevant Orders   Hemoglobin A1c     Nervous and Auditory   Sciatica    -Rx. Tizanidine -Rx. Prednisone -if no improvement in 1 week, will consider ortho consult       Relevant Medications   predniSONE (DELTASONE) 10 MG tablet   tiZANidine (ZANAFLEX) 4 MG tablet    Meds ordered this encounter  Medications   predniSONE (DELTASONE) 10 MG tablet    Sig: Take 6 tablets (60 mg total) by mouth daily with breakfast for 1 day, THEN 5 tablets (50 mg total) daily with breakfast for 1 day, THEN 4 tablets (40 mg total) daily with breakfast for 1 day, THEN 3 tablets (30 mg total) daily with breakfast for 1 day, THEN 2 tablets (20 mg total) daily with  breakfast for 1 day, THEN 1 tablet (10 mg total) daily with breakfast for 1 day.    Dispense:  21 tablet    Refill:  0   tiZANidine (ZANAFLEX) 4 MG tablet    Sig: Take 1 tablet (4 mg total) by mouth every 6 (six) hours as needed for muscle spasms.    Dispense:  30 tablet    Refill:  0    Follow-up: Return in about 4 months (around 06/30/2021) for Lab follow-up (IFG, HTN, thyroid) (same-day fasting labs).    Noreene Larsson, NP

## 2021-03-01 ENCOUNTER — Other Ambulatory Visit: Payer: Self-pay | Admitting: Nurse Practitioner

## 2021-03-01 DIAGNOSIS — E039 Hypothyroidism, unspecified: Secondary | ICD-10-CM

## 2021-03-01 LAB — CMP14+EGFR
ALT: 13 IU/L (ref 0–32)
AST: 16 IU/L (ref 0–40)
Albumin/Globulin Ratio: 1.7 (ref 1.2–2.2)
Albumin: 4.3 g/dL (ref 3.7–4.7)
Alkaline Phosphatase: 100 IU/L (ref 44–121)
BUN/Creatinine Ratio: 24 (ref 12–28)
BUN: 17 mg/dL (ref 8–27)
Bilirubin Total: 0.8 mg/dL (ref 0.0–1.2)
CO2: 20 mmol/L (ref 20–29)
Calcium: 9.8 mg/dL (ref 8.7–10.3)
Chloride: 106 mmol/L (ref 96–106)
Creatinine, Ser: 0.71 mg/dL (ref 0.57–1.00)
Globulin, Total: 2.6 g/dL (ref 1.5–4.5)
Glucose: 101 mg/dL — ABNORMAL HIGH (ref 65–99)
Potassium: 4.2 mmol/L (ref 3.5–5.2)
Sodium: 143 mmol/L (ref 134–144)
Total Protein: 6.9 g/dL (ref 6.0–8.5)
eGFR: 87 mL/min/{1.73_m2} (ref >59)

## 2021-03-01 LAB — CBC WITH DIFFERENTIAL/PLATELET
Basophils Absolute: 0.1 10*3/uL (ref 0.0–0.2)
Basos: 1 %
EOS (ABSOLUTE): 0.2 10*3/uL (ref 0.0–0.4)
Eos: 3 %
Hematocrit: 46.3 % (ref 34.0–46.6)
Hemoglobin: 15.3 g/dL (ref 11.1–15.9)
Immature Grans (Abs): 0 10*3/uL (ref 0.0–0.1)
Immature Granulocytes: 0 %
Lymphocytes Absolute: 1.4 10*3/uL (ref 0.7–3.1)
Lymphs: 24 %
MCH: 30.2 pg (ref 26.6–33.0)
MCHC: 33 g/dL (ref 31.5–35.7)
MCV: 92 fL (ref 79–97)
Monocytes Absolute: 0.9 10*3/uL (ref 0.1–0.9)
Monocytes: 15 %
Neutrophils Absolute: 3.3 10*3/uL (ref 1.4–7.0)
Neutrophils: 57 %
Platelets: 237 10*3/uL (ref 150–450)
RBC: 5.06 x10E6/uL (ref 3.77–5.28)
RDW: 13.7 % (ref 11.7–15.4)
WBC: 5.8 10*3/uL (ref 3.4–10.8)

## 2021-03-01 LAB — HEMOGLOBIN A1C
Est. average glucose Bld gHb Est-mCnc: 117 mg/dL
Hgb A1c MFr Bld: 5.7 % — ABNORMAL HIGH (ref 4.8–5.6)

## 2021-03-01 LAB — LIPID PANEL WITH LDL/HDL RATIO
Cholesterol, Total: 173 mg/dL (ref 100–199)
HDL: 50 mg/dL (ref >39)
LDL Chol Calc (NIH): 101 mg/dL — ABNORMAL HIGH (ref 0–99)
LDL/HDL Ratio: 2 ratio (ref 0.0–3.2)
Triglycerides: 125 mg/dL (ref 0–149)
VLDL Cholesterol Cal: 22 mg/dL (ref 5–40)

## 2021-03-01 LAB — TSH+FREE T4
Free T4: 1.03 ng/dL (ref 0.82–1.77)
TSH: 5.27 u[IU]/mL — ABNORMAL HIGH (ref 0.450–4.500)

## 2021-03-01 MED ORDER — LEVOTHYROXINE SODIUM 75 MCG PO TABS: 75.0000 ug | ORAL_TABLET | Freq: Every day | ORAL | 3 refills | Status: DC

## 2021-03-01 NOTE — Progress Notes (Signed)
TSH is running high, so her thyroid function is a little low. So, I increased her levothyroxine to 75 mcg.

## 2021-03-01 NOTE — Progress Notes (Signed)
Pt informed

## 2021-05-15 ENCOUNTER — Telehealth: Payer: Self-pay | Admitting: Family Medicine

## 2021-05-15 NOTE — Telephone Encounter (Signed)
Copied from CRM [id number]. Topic: Medicare AWV >> May 15, 2021 10:58 AM Claudette Laws R wrote: Reason for CRM:   Left message for patient to call back and schedule Medicare Annual Wellness Visit (AWV) in office.   If unable to come into the office for AWV,  please offer to do virtually or by telephone.  No history of AWV: 03/17/2014   Please schedule at anytime with [facility] Health Advisor.  40 minute appointment  Any questions, please contact me at [phone]

## 2021-06-05 ENCOUNTER — Telehealth: Payer: Self-pay | Admitting: Family Medicine

## 2021-06-05 NOTE — Telephone Encounter (Signed)
  Left message for patient to call back and schedule Medicare Annual Wellness Visit (AWV) in office.   If unable to come into the office for AWV,  please offer to do virtually or by telephone.  No hx of AWV eligible for AWVI as of 03/17/2014  Please schedule at anytime with [facility]Nurse Health Advisor.      40 Minutes appointment   Any questions, please call me at [phone]

## 2021-07-04 ENCOUNTER — Encounter (INDEPENDENT_AMBULATORY_CARE_PROVIDER_SITE_OTHER): Payer: Self-pay

## 2021-07-04 ENCOUNTER — Ambulatory Visit: Payer: Medicare HMO | Admitting: Nurse Practitioner

## 2021-07-04 ENCOUNTER — Other Ambulatory Visit: Payer: Self-pay

## 2021-07-04 ENCOUNTER — Encounter: Payer: Self-pay | Admitting: Internal Medicine

## 2021-07-04 ENCOUNTER — Ambulatory Visit (INDEPENDENT_AMBULATORY_CARE_PROVIDER_SITE_OTHER): Payer: Medicare HMO | Admitting: Internal Medicine

## 2021-07-04 ENCOUNTER — Ambulatory Visit: Payer: Medicare HMO | Admitting: Orthopaedic Surgery

## 2021-07-04 VITALS — BP 146/86 | HR 71 | Ht 67.0 in | Wt 179.0 lb

## 2021-07-04 DIAGNOSIS — M5432 Sciatica, left side: Secondary | ICD-10-CM | POA: Diagnosis not present

## 2021-07-04 DIAGNOSIS — W19XXXA Unspecified fall, initial encounter: Secondary | ICD-10-CM | POA: Diagnosis not present

## 2021-07-04 MED ORDER — TIZANIDINE HCL 4 MG PO TABS: 4.0000 mg | ORAL_TABLET | Freq: Three times a day (TID) | ORAL | 0 refills | Status: DC | PRN

## 2021-07-04 MED ORDER — KETOROLAC TROMETHAMINE 60 MG/2ML IM SOLN
60.0000 mg | Freq: Once | INTRAMUSCULAR | Status: AC
  Administered 2021-07-04: 60 mg via INTRAMUSCULAR

## 2021-07-04 MED ORDER — NAPROXEN SODIUM 550 MG PO TABS: 550.0000 mg | ORAL_TABLET | Freq: Two times a day (BID) | ORAL | 0 refills | Status: DC

## 2021-07-04 NOTE — Patient Instructions (Signed)
Please take Naproxen as needed up to twice daily for back pain.  Take Tizanidine up to 3 times daily for muscle spasms.

## 2021-07-04 NOTE — Progress Notes (Signed)
Acute Office Visit  Subjective:    Patient ID: Natasha Willis, female    DOB: Aug 19, 1941, 80 y.o.   MRN: [id number]  Chief Complaint  Patient presents with   Acute Visit    Sciatic Nerve pain worse since fall 10/8. Fell and hit her head no LOC. Bilateral wrist pain from catching her fall. Nerve pain radiates down left leg and into toes    HPI Patient is in today for c/o back pain, which is worse since fall about 10 days ago outside her home. She states she fell due to a cat and hit her left side of head. She fell on outstretched hands, had left wrist swelling, which has improved now.  She has bruising near her left eyebrow, which has been improving.  She denies any LOC.  She woke up after the fall and went inside her home by her self.  Of note, she has chronic low back pain, radiating to left LE, which has recurred/got worse since the fall.  She denies any numbness or tingling of the LE.  She does report weakness of the LE since yesterday and she has difficulty walking.  Past Medical History:  Diagnosis Date   Asthma    Diverticulitis    Hypertension    Thyroid disease     Past Surgical History:  Procedure Laterality Date   ABDOMINAL HYSTERECTOMY     ANTERIOR AND POSTERIOR REPAIR     APPENDECTOMY     CATARACT EXTRACTION W/PHACO Right 08/19/2014   Procedure: CATARACT EXTRACTION PHACO AND INTRAOCULAR LENS PLACEMENT RIGHT EYE;  Surgeon: Tonny Branch, MD;  Location: [facility];  Service: Ophthalmology;  Laterality: Right;  CDE:7.60   CATARACT EXTRACTION W/PHACO Left 08/30/2014   Procedure: CATARACT EXTRACTION PHACO AND INTRAOCULAR LENS PLACEMENT (IOC);  Surgeon: Tonny Branch, MD;  Location: [facility];  Service: Ophthalmology;  Laterality: Left;  CDE:5.49   CHOLECYSTECTOMY     TUBAL LIGATION      Family History  Problem Relation Age of Onset   Diabetes Brother    Heart disease Brother    Diabetes Brother    Heart disease Brother     Social History   Socioeconomic History   Marital  status: Legally Separated    Spouse name: Not on file   Number of children: 5   Years of education: Not on file   Highest education level: Not on file  Occupational History   Not on file  Tobacco Use   Smoking status: Never   Smokeless tobacco: Never  Substance and Sexual Activity   Alcohol use: No   Drug use: No   Sexual activity: Not Currently    Birth control/protection: Surgical  Other Topics Concern   Not on file  Social History Narrative   Lives alone   5 children, one lives 2 doors down.   [place], [employer], and 2 in [place]      Cat: Frankey Poot      Enjoys: reading-bible, christian books      Diet: strict diet (avoid sugar)   Caffeine: tea daily   Water: 6-8 cups       Wears seat belt- does not driving, daughter drives her    Oceanographer at home   No weapons   Social Determinants of Health   Financial Resource Strain: Not on file  Food Insecurity: Not on file  Transportation Needs: Not on file  Physical Activity: Not on file  Stress: Not on file  Social Connections: Not on  file  Intimate Partner Violence: Not on file    Outpatient Medications Prior to Visit  Medication Sig Dispense Refill   carboxymethylcellulose 1 % ophthalmic solution 1 drop 3 (three) times daily.     Fish Oil-Cholecalciferol (FISH OIL + D3) 1000-1000 MG-UNIT CAPS Take 1 capsule by mouth daily.      levothyroxine (SYNTHROID) 75 MCG tablet Take 1 tablet (75 mcg total) by mouth daily. 90 tablet 3   Multiple Vitamins-Minerals (CENTRAL-VITE FOR SENIORS PO) Take 1 tablet by mouth daily.      tiZANidine (ZANAFLEX) 4 MG tablet Take 1 tablet (4 mg total) by mouth every 6 (six) hours as needed for muscle spasms. 30 tablet 0   No facility-administered medications prior to visit.    Allergies  Allergen Reactions   Penicillins     Review of Systems  Constitutional:  Negative for chills and fever.  HENT:  Negative for congestion, sinus pressure and sinus pain.   Eyes:  Negative for redness.   Respiratory:  Negative for cough and shortness of breath.   Gastrointestinal:  Negative for diarrhea and vomiting.  Musculoskeletal:  Positive for arthralgias, back pain and gait problem. Negative for neck pain and neck stiffness.  Skin:  Negative for rash.  Neurological:  Negative for dizziness and weakness.  Psychiatric/Behavioral:  Negative for agitation and behavioral problems.       Objective:    Physical Exam Vitals reviewed.  Constitutional:      General: She is not in acute distress.    Appearance: She is not diaphoretic.     Comments: In wheelchair  HENT:     Head: Normocephalic.     Comments: Bruising near left eyebrow, healing    Nose: Nose normal.     Mouth/Throat:     Mouth: Mucous membranes are moist.  Eyes:     General: No scleral icterus.    Extraocular Movements: Extraocular movements intact.  Cardiovascular:     Rate and Rhythm: Normal rate and regular rhythm.     Pulses: Normal pulses.     Heart sounds: Normal heart sounds. No murmur heard. Pulmonary:     Breath sounds: Normal breath sounds. No wheezing or rales.  Musculoskeletal:        General: No tenderness (Lumbar spine area).     Cervical back: Neck supple. No tenderness.     Right lower leg: No edema.     Left lower leg: No edema.  Skin:    General: Skin is warm.     Findings: No rash.  Neurological:     General: No focal deficit present.     Mental Status: She is alert and oriented to person, place, and time.  Psychiatric:        Mood and Affect: Mood normal.        Behavior: Behavior normal.    BP (!) 146/86 (BP Location: Right Arm, Patient Position: Sitting, Cuff Size: Normal)   Pulse 71   Ht _0  (1.702 m)   Wt 179 lb (81.2 kg)   SpO2 97%   BMI 28.04 kg/m  Wt Readings from Last 3 Encounters:  07/04/21 179 lb (81.2 kg)  02/28/21 187 lb (84.8 kg)  10/26/20 191 lb (86.6 kg)    Health Maintenance Due  Topic Date Due   URINE MICROALBUMIN  Never done   Hepatitis C Screening   Never done   Zoster Vaccines- Shingrix (1 of 2) Never done   DEXA SCAN  Never done  There are no preventive care reminders to display for this patient.   Lab Results  Component Value Date   TSH 5.270 (H) 02/28/2021   Lab Results  Component Value Date   WBC 5.8 02/28/2021   HGB 15.3 02/28/2021   HCT 46.3 02/28/2021   MCV 92 02/28/2021   PLT 237 02/28/2021   Lab Results  Component Value Date   NA 143 02/28/2021   K 4.2 02/28/2021   CO2 20 02/28/2021   GLUCOSE 101 (H) 02/28/2021   BUN 17 02/28/2021   CREATININE 0.71 02/28/2021   BILITOT 0.8 02/28/2021   ALKPHOS 100 02/28/2021   AST 16 02/28/2021   ALT 13 02/28/2021   PROT 6.9 02/28/2021   ALBUMIN 4.3 02/28/2021   CALCIUM 9.8 02/28/2021   ANIONGAP 11 08/17/2014   EGFR 87 02/28/2021   Lab Results  Component Value Date   CHOL 173 02/28/2021   Lab Results  Component Value Date   HDL 50 02/28/2021   Lab Results  Component Value Date   LDLCALC 101 (H) 02/28/2021   Lab Results  Component Value Date   TRIG 125 02/28/2021   Lab Results  Component Value Date   CHOLHDL 3.5 12/28/2019   Lab Results  Component Value Date   HGBA1C 5.7 (H) 02/28/2021       Assessment & Plan:   Problem List Items Addressed This Visit       Nervous and Auditory   Sciatica Toradol given in the office Naproxen PRN for pain Zanaflex PRN for muscle spasms Referred to Orthopedic surgery   Relevant Medications   naproxen sodium (ANAPROX) 550 MG tablet   tiZANidine (ZANAFLEX) 4 MG tablet   Other Relevant Orders   Ambulatory referral to Orthopedic Surgery     Other   Fall - Primary On 10/08, appears to be mechanical fall Face, left wrist and hip injury No concern for subdural hematoma Referred to Orthopedic surgery - may get X-ray of left wrist, lumbar spine and left hip She is able to walk, but with difficulty currently since yesterday    Relevant Orders   Ambulatory referral to Orthopedic Surgery     Meds ordered  this encounter  Medications   naproxen sodium (ANAPROX) 550 MG tablet    Sig: Take 1 tablet (550 mg total) by mouth 2 (two) times daily with a meal.    Dispense:  30 tablet    Refill:  0   tiZANidine (ZANAFLEX) 4 MG tablet    Sig: Take 1 tablet (4 mg total) by mouth every 8 (eight) hours as needed for muscle spasms.    Dispense:  30 tablet    Refill:  0   ketorolac (TORADOL) injection 60 mg     Lindell Spar, MD

## 2021-07-06 ENCOUNTER — Encounter: Payer: Self-pay | Admitting: Orthopaedic Surgery

## 2021-07-06 ENCOUNTER — Ambulatory Visit: Payer: Medicare HMO

## 2021-07-06 ENCOUNTER — Ambulatory Visit (INDEPENDENT_AMBULATORY_CARE_PROVIDER_SITE_OTHER): Payer: Medicare HMO | Admitting: Orthopaedic Surgery

## 2021-07-06 ENCOUNTER — Other Ambulatory Visit: Payer: Self-pay

## 2021-07-06 VITALS — BP 136/75 | HR 74 | Ht 67.0 in | Wt 172.0 lb

## 2021-07-06 DIAGNOSIS — M5442 Lumbago with sciatica, left side: Secondary | ICD-10-CM | POA: Diagnosis not present

## 2021-07-06 DIAGNOSIS — G8929 Other chronic pain: Secondary | ICD-10-CM

## 2021-07-06 DIAGNOSIS — Z13820 Encounter for screening for osteoporosis: Secondary | ICD-10-CM

## 2021-07-06 NOTE — Addendum Note (Signed)
Addended by: Baird Kay on: 07/06/2021 10:23 AM   Modules accepted: Orders

## 2021-07-06 NOTE — Progress Notes (Signed)
Subjective:    Patient ID: Natasha Willis, female    DOB: Jan 02, 1941, 80 y.o.   MRN: [id number]  HPI She has long history of lower back pain getting worse since mid July.  She has left sided paresthesias.  She fell about two weeks ago and has much more pain. She has seen her family doctor and I have the notes.  She has difficulty standing now, pain down the back of the left leg to the foot, pain in the left side of the lower back.  She is not relieved with Tylenol, heat or ice.  She is better laying down and bending her knees.  She has no weakness or bowel or bladder problem.   Review of Systems  Constitutional:  Positive for activity change.  Respiratory:  Positive for shortness of breath.   Musculoskeletal:  Positive for arthralgias, back pain, gait problem and myalgias.  Neurological:  Positive for headaches.  All other systems reviewed and are negative. For Review of Systems, all other systems reviewed and are negative.  The following is a summary of the past history medically, past history surgically, known current medicines, social history and family history.  This information is gathered electronically by the computer from prior information and documentation.  I review this each visit and have found including this information at this point in the chart is beneficial and informative.   Past Medical History:  Diagnosis Date   Asthma    Diverticulitis    Hypertension    Thyroid disease     Past Surgical History:  Procedure Laterality Date   ABDOMINAL HYSTERECTOMY     ANTERIOR AND POSTERIOR REPAIR     APPENDECTOMY     CATARACT EXTRACTION W/PHACO Right 08/19/2014   Procedure: CATARACT EXTRACTION PHACO AND INTRAOCULAR LENS PLACEMENT RIGHT EYE;  Surgeon: Gemma Payor, MD;  Location: [facility];  Service: Ophthalmology;  Laterality: Right;  CDE:7.60   CATARACT EXTRACTION W/PHACO Left 08/30/2014   Procedure: CATARACT EXTRACTION PHACO AND INTRAOCULAR LENS PLACEMENT (IOC);  Surgeon:  Gemma Payor, MD;  Location: [facility];  Service: Ophthalmology;  Laterality: Left;  CDE:5.49   CHOLECYSTECTOMY     TUBAL LIGATION      Current Outpatient Medications on File Prior to Visit  Medication Sig Dispense Refill   carboxymethylcellulose 1 % ophthalmic solution 1 drop 3 (three) times daily.     Fish Oil-Cholecalciferol (FISH OIL + D3) 1000-1000 MG-UNIT CAPS Take 1 capsule by mouth daily.      levothyroxine (SYNTHROID) 75 MCG tablet Take 1 tablet (75 mcg total) by mouth daily. 90 tablet 3   Multiple Vitamins-Minerals (CENTRAL-VITE FOR SENIORS PO) Take 1 tablet by mouth daily.      naproxen (NAPROSYN) 500 MG tablet Take 500 mg by mouth 2 (two) times daily.     naproxen sodium (ANAPROX) 550 MG tablet Take 1 tablet (550 mg total) by mouth 2 (two) times daily with a meal. 30 tablet 0   tiZANidine (ZANAFLEX) 4 MG tablet Take 1 tablet (4 mg total) by mouth every 8 (eight) hours as needed for muscle spasms. 30 tablet 0   No current facility-administered medications on file prior to visit.    Social History   Socioeconomic History   Marital status: Legally Separated    Spouse name: Not on file   Number of children: 5   Years of education: Not on file   Highest education level: Not on file  Occupational History   Not on file  Tobacco Use  Smoking status: Never   Smokeless tobacco: Never  Substance and Sexual Activity   Alcohol use: No   Drug use: No   Sexual activity: Not Currently    Birth control/protection: Surgical  Other Topics Concern   Not on file  Social History Narrative   Lives alone   5 children, one lives 2 doors down.   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   No weapons   Social Determinants of Health   Financial Resource Strain: Not on  file  Food Insecurity: Not on file  Transportation Needs: Not on file  Physical Activity: Not on file  Stress: Not on file  Social Connections: Not on file  Intimate Partner Violence: Not on file    Family History  Problem Relation Age of Onset   Diabetes Brother    Heart disease Brother    Diabetes Brother    Heart disease Brother     BP 136/75   Pulse 74   Ht 5\' 7"  (1.702 m)   Wt 172 lb (78 kg)   BMI 26.94 kg/m   Body mass index is 26.94 kg/m.     Objective:   Physical Exam Vitals and nursing note reviewed. Exam conducted with a chaperone present.  Constitutional:      Appearance: She is well-developed.  HENT:     Head: Normocephalic and atraumatic.  Eyes:     Conjunctiva/sclera: Conjunctivae normal.     Pupils: Pupils are equal, round, and reactive to light.  Cardiovascular:     Rate and Rhythm: Normal rate and regular rhythm.  Pulmonary:     Effort: Pulmonary effort is normal.  Abdominal:     Palpations: Abdomen is soft.  Musculoskeletal:       Arms:     Cervical back: Normal range of motion and neck supple.  Skin:    General: Skin is warm and dry.  Neurological:     Mental Status: She is alert and oriented to person, place, and time.     Cranial Nerves: No cranial nerve deficit.     Motor: No abnormal muscle tone.     Coordination: Coordination normal.     Deep Tendon Reflexes: Reflexes are normal and symmetric. Reflexes normal.  Psychiatric:        Behavior: Behavior normal.        Thought Content: Thought content normal.        Judgment: Judgment normal.  X-rays were done of the lumbar spine, reported separately.        Assessment & Plan:   Encounter Diagnosis  Name Primary?   Chronic midline low back pain with left-sided sciatica Yes   She also has significant osteoporosis. I will get MRI to rule out compression fracture(s).  I will also get bone density study.  Return in three weeks.  Tylenol for pain.  Call if any  problem.  Precautions discussed.  Electronically Signed , MD 10/20/20228:44 AM

## 2021-07-18 ENCOUNTER — Other Ambulatory Visit: Payer: Self-pay

## 2021-07-18 ENCOUNTER — Ambulatory Visit (HOSPITAL_COMMUNITY)
Admission: RE | Admit: 2021-07-18 | Discharge: 2021-07-18 | Disposition: A | Discharge status: Home / Self Care | Payer: Medicare HMO | Source: Ambulatory Visit | Attending: Orthopaedic Surgery | Admitting: Orthopaedic Surgery

## 2021-07-18 DIAGNOSIS — M5442 Lumbago with sciatica, left side: Secondary | ICD-10-CM

## 2021-07-18 DIAGNOSIS — S3992XA Unspecified injury of lower back, initial encounter: Secondary | ICD-10-CM | POA: Diagnosis not present

## 2021-07-18 DIAGNOSIS — M810 Age-related osteoporosis without current pathological fracture: Secondary | ICD-10-CM | POA: Diagnosis not present

## 2021-07-18 DIAGNOSIS — M47816 Spondylosis without myelopathy or radiculopathy, lumbar region: Secondary | ICD-10-CM | POA: Diagnosis not present

## 2021-07-18 DIAGNOSIS — Z13820 Encounter for screening for osteoporosis: Secondary | ICD-10-CM | POA: Diagnosis not present

## 2021-07-18 DIAGNOSIS — G8929 Other chronic pain: Secondary | ICD-10-CM | POA: Insufficient documentation

## 2021-07-18 DIAGNOSIS — M545 Low back pain, unspecified: Secondary | ICD-10-CM | POA: Diagnosis not present

## 2021-07-18 DIAGNOSIS — Z780 Asymptomatic menopausal state: Secondary | ICD-10-CM | POA: Diagnosis not present

## 2021-07-27 ENCOUNTER — Other Ambulatory Visit: Payer: Self-pay

## 2021-07-27 ENCOUNTER — Ambulatory Visit: Payer: Medicare HMO | Admitting: Orthopaedic Surgery

## 2021-07-27 ENCOUNTER — Encounter: Payer: Self-pay | Admitting: Orthopaedic Surgery

## 2021-07-27 DIAGNOSIS — M8448XA Pathological fracture, other site, initial encounter for fracture: Secondary | ICD-10-CM | POA: Diagnosis not present

## 2021-07-27 DIAGNOSIS — M8000XA Age-related osteoporosis with current pathological fracture, unspecified site, initial encounter for fracture: Secondary | ICD-10-CM | POA: Diagnosis not present

## 2021-07-27 NOTE — Progress Notes (Signed)
I am hurting some but not as much  She had the bone density study done and the MRI of the lumbar area.  Bone density shows osteoporosis.  I have talked to her about this.  Begin Vitamin D3, 2000 IU daily.  It will take some time for this to take effect.  Continue her milk intake.  The MRI scan showed: IMPRESSION: 1. Lumbar spine spondylosis as described above. 2. Partially visualized, left sacral insufficiency fracture with surrounding bone marrow edema. 3. No acute osseous injury of the lumbar spine.   I have explained the findings to her.  It will take several more weeks for this to heal.  Precautions discussed.  I have independently reviewed the MRI.    Her back is tender more on the mid line area.  She has no spasm.  NV intact.  Encounter Diagnoses  Name Primary?   Sacral insufficiency fracture, initial encounter Yes   Age-related osteoporosis with current pathological fracture, initial encounter    Return in three weeks.  Call if any problem.  Precautions discussed.  Electronically Signed Darreld Mclean, MD 11/10/202210:24 AM

## 2021-08-02 ENCOUNTER — Ambulatory Visit: Payer: Medicare HMO | Admitting: Nurse Practitioner

## 2021-08-03 ENCOUNTER — Encounter: Payer: Self-pay | Admitting: Nurse Practitioner

## 2021-08-03 ENCOUNTER — Other Ambulatory Visit: Payer: Self-pay

## 2021-08-03 ENCOUNTER — Ambulatory Visit (INDEPENDENT_AMBULATORY_CARE_PROVIDER_SITE_OTHER): Payer: Medicare HMO | Admitting: Nurse Practitioner

## 2021-08-03 VITALS — BP 138/88 | HR 82 | Ht 66.0 in | Wt 191.0 lb

## 2021-08-03 DIAGNOSIS — Z1321 Encounter for screening for nutritional disorder: Secondary | ICD-10-CM | POA: Diagnosis not present

## 2021-08-03 DIAGNOSIS — M810 Age-related osteoporosis without current pathological fracture: Secondary | ICD-10-CM | POA: Insufficient documentation

## 2021-08-03 DIAGNOSIS — M8000XS Age-related osteoporosis with current pathological fracture, unspecified site, sequela: Secondary | ICD-10-CM

## 2021-08-03 DIAGNOSIS — I10 Essential (primary) hypertension: Secondary | ICD-10-CM | POA: Diagnosis not present

## 2021-08-03 DIAGNOSIS — M5431 Sciatica, right side: Secondary | ICD-10-CM | POA: Diagnosis not present

## 2021-08-03 DIAGNOSIS — R7303 Prediabetes: Secondary | ICD-10-CM

## 2021-08-03 DIAGNOSIS — E039 Hypothyroidism, unspecified: Secondary | ICD-10-CM

## 2021-08-03 DIAGNOSIS — R7989 Other specified abnormal findings of blood chemistry: Secondary | ICD-10-CM | POA: Diagnosis not present

## 2021-08-03 DIAGNOSIS — Z139 Encounter for screening, unspecified: Secondary | ICD-10-CM | POA: Diagnosis not present

## 2021-08-03 DIAGNOSIS — M5432 Sciatica, left side: Secondary | ICD-10-CM | POA: Diagnosis not present

## 2021-08-03 MED ORDER — NAPROXEN 500 MG PO TABS
500.0000 mg | ORAL_TABLET | Freq: Two times a day (BID) | ORAL | 0 refills | Status: DC | PRN
Start: 2021-08-03 — End: 2023-03-28

## 2021-08-03 NOTE — Progress Notes (Signed)
   ROMIE KEEBLE     MRN: [id number]      DOB: [birth year]   HPI Ms. Croson is here for follow up and re-evaluation of chronic medical conditions, medication management and review of any available recent lab and radiology data.  Preventive health is updated, specifically  Cancer screening and Immunization.   Questions or concerns regarding consultations or procedures which the PT has had in the interim are  addressed. The PT denies any adverse reactions to current medications since the last visit.  There are no new concerns.  There are no specific complaints   ROS Denies recent fever or chills. Denies sinus pressure, nasal congestion, ear pain or sore throat. Denies chest congestion, productive cough or wheezing. Denies chest pains, palpitations and leg swelling Denies abdominal pain, nausea, vomiting,diarrhea or constipation.   Denies dysuria, frequency, hesitancy or incontinence. Has chronic low back pain, left leg pain from sciatica  swelling and limitation in mobility, uses a walker Denies headaches, seizures, numbness, or tingling. Denies depression, anxiety or insomnia. Denies skin break down or rash.   PE  BP 138/88 (BP Location: Right Arm, Cuff Size: Normal)   Pulse 82   Ht 5\' 6"  (1.676 m)   Wt 191 lb (86.6 kg)   SpO2 94%   BMI 30.83 kg/m   Patient alert and oriented and in no cardiopulmonary distress.  HEENT: No facial asymmetry, EOMI,     Neck supple .  Chest: Clear to auscultation bilaterally.  CVS: S1, S2 no murmurs, no S3.Regular rate.  ABD: Soft non tender.   Ext: No edema  MS: scoliosis of spine, Tenderness of right leg on palpation. Normal ROM of shoulders  Skin: Intact, no ulcerations or rash noted.  Psych: Good eye contact, normal affect. Memory intact not anxious or depressed appearing.  CNS: normal throughout.no focal deficits noted.   Assessment & Plan

## 2021-08-03 NOTE — Assessment & Plan Note (Signed)
Condition stable. Followed by otho. Take naproxen 500 mg PRN for pain.

## 2021-08-03 NOTE — Assessment & Plan Note (Addendum)
Condition been managed by  otho. Fall prevention  education completed  with patient. She verbalized understanding.  Continue VITA D supplement and milk intake

## 2021-08-03 NOTE — Assessment & Plan Note (Signed)
Not on medication. Recheck labs TSH today.  Lab Results  Component Value Date   TSH 5.270 (H) 02/28/2021

## 2021-08-03 NOTE — Assessment & Plan Note (Signed)
BP Readings from Last 3 Encounters:  08/03/21 138/88  07/06/21 136/75  07/04/21 (!) 146/86  condition under control. Not on medication. Importance of heart healthy / low salt/low fast diet discussed with pt.

## 2021-08-03 NOTE — Assessment & Plan Note (Signed)
Lab Results  Component Value Date   HGBA1C 5.7 (H) 02/28/2021  Recheck A1 c today.  Importance of low carb diet discussed with pt.

## 2021-08-03 NOTE — Patient Instructions (Signed)
Please get your labs done today.  Follow up in 6 months.    Eat a healthy diet, including lots of fruits and vegetables. Avoid foods with a lot of saturated and trans fats, such as red meat, butter, fried foods and cheese to maintain a healthy weight.   Thanks for choosing [facility] for your health care needs. We appreciate the opportunity to serve you.

## 2021-08-04 LAB — VITAMIN D 25 HYDROXY (VIT D DEFICIENCY, FRACTURES): Vit D, 25-Hydroxy: 35.5 ng/mL (ref 30.0–100.0)

## 2021-08-05 LAB — MICROALBUMIN, URINE: Microalbumin, Urine: 5.9 ug/mL

## 2021-08-05 LAB — HEPATITIS C ANTIBODY: Hep C Virus Ab: 0.1 s/co ratio (ref 0.0–0.9)

## 2021-08-05 LAB — TSH: TSH: 4.33 u[IU]/mL (ref 0.450–4.500)

## 2021-08-09 ENCOUNTER — Encounter: Payer: Self-pay | Admitting: *Deleted

## 2021-08-17 ENCOUNTER — Ambulatory Visit (INDEPENDENT_AMBULATORY_CARE_PROVIDER_SITE_OTHER): Payer: Medicare HMO | Admitting: Orthopaedic Surgery

## 2021-08-17 ENCOUNTER — Encounter: Payer: Self-pay | Admitting: Orthopaedic Surgery

## 2021-08-17 ENCOUNTER — Other Ambulatory Visit: Payer: Self-pay

## 2021-08-17 DIAGNOSIS — M8448XA Pathological fracture, other site, initial encounter for fracture: Secondary | ICD-10-CM

## 2021-08-17 DIAGNOSIS — M8000XA Age-related osteoporosis with current pathological fracture, unspecified site, initial encounter for fracture: Secondary | ICD-10-CM

## 2021-08-17 NOTE — Progress Notes (Signed)
I am better.  She has begun her Vitamin D.  She is changing her diet. She is using her walker.  She has less pain.  NV Intact.  Gait is good with walker.  She has minimal back pain.  Encounter Diagnoses  Name Primary?   Sacral insufficiency fracture, initial encounter Yes   Age-related osteoporosis with current pathological fracture, initial encounter    Continue as she is doing.  X-rays of sacrum next visit in one month.  Call if any problem.  Precautions discussed.  Electronically Signed Darreld Mclean, MD 12/1/202210:46 AM

## 2021-09-21 ENCOUNTER — Encounter: Payer: Self-pay | Admitting: Orthopaedic Surgery

## 2021-09-21 ENCOUNTER — Ambulatory Visit: Payer: Medicare HMO

## 2021-09-21 ENCOUNTER — Ambulatory Visit: Payer: Medicare HMO | Admitting: Orthopaedic Surgery

## 2021-09-21 ENCOUNTER — Other Ambulatory Visit: Payer: Self-pay

## 2021-09-21 DIAGNOSIS — M8448XD Pathological fracture, other site, subsequent encounter for fracture with routine healing: Secondary | ICD-10-CM

## 2021-09-21 NOTE — Progress Notes (Signed)
I am better.  She has good and bad days with the sacrum fracture.  She has less pain.  She is using a walker now.  I have told her about getting her endurance improved.  I have gone over this with her and her daughter.  NV intact.  She is using a walker well.  Very little back pain.  X-rays were done of the sacrum, reported separately.  Encounter Diagnosis  Name Primary?   Sacral insufficiency fracture with routine healing, subsequent encounter Yes   I will see in two weeks.  Increase activity at home.  Call if any problem.  Precautions discussed.  Electronically Signed Darreld Mclean, MD 1/5/202310:48 AM

## 2021-10-05 ENCOUNTER — Other Ambulatory Visit: Payer: Self-pay

## 2021-10-05 ENCOUNTER — Encounter: Payer: Self-pay | Admitting: Orthopaedic Surgery

## 2021-10-05 ENCOUNTER — Ambulatory Visit (INDEPENDENT_AMBULATORY_CARE_PROVIDER_SITE_OTHER): Payer: Medicare HMO | Admitting: Orthopaedic Surgery

## 2021-10-05 VITALS — Ht 66.0 in | Wt 193.0 lb

## 2021-10-05 DIAGNOSIS — M8448XD Pathological fracture, other site, subsequent encounter for fracture with routine healing: Secondary | ICD-10-CM

## 2021-10-05 DIAGNOSIS — M8000XD Age-related osteoporosis with current pathological fracture, unspecified site, subsequent encounter for fracture with routine healing: Secondary | ICD-10-CM

## 2021-10-05 NOTE — Progress Notes (Signed)
I am feeling better.  She has less back pain.  NV intact.  She is using walker well.  Encounter Diagnoses  Name Primary?   Sacral insufficiency fracture with routine healing, subsequent encounter Yes   Age-related osteoporosis with current pathological fracture with routine healing, subsequent encounter    Continue exercises and be active.  Return in one month.  Call if any problem.  Precautions discussed.  Electronically Signed Darreld Mclean, MD 1/19/202310:42 AM

## 2021-11-02 ENCOUNTER — Ambulatory Visit: Payer: Medicare HMO | Admitting: Orthopaedic Surgery

## 2021-11-02 ENCOUNTER — Encounter: Payer: Self-pay | Admitting: Orthopaedic Surgery

## 2021-11-02 ENCOUNTER — Other Ambulatory Visit: Payer: Self-pay

## 2021-11-02 VITALS — Ht 66.0 in | Wt 193.0 lb

## 2021-11-02 DIAGNOSIS — M8448XD Pathological fracture, other site, subsequent encounter for fracture with routine healing: Secondary | ICD-10-CM | POA: Diagnosis not present

## 2021-11-02 DIAGNOSIS — M8000XD Age-related osteoporosis with current pathological fracture, unspecified site, subsequent encounter for fracture with routine healing: Secondary | ICD-10-CM | POA: Diagnosis not present

## 2021-11-02 NOTE — Progress Notes (Signed)
I feel a little better.  She had increased pain on 2-14 but otherwise has had less pain and is more functional.  She uses her walker. She has no numbness, no weakness.  She relates increased pain to changes in weather.  We have had very warm days then very cold days very close together.  She has no new trauma.  She is using her walker well. She has some tenderness of the lower back and sacral area but NV intact, DTRs normal, muscle tone and strength normal.  Encounter Diagnoses  Name Primary?   Sacral insufficiency fracture with routine healing, subsequent encounter Yes   Age-related osteoporosis with current pathological fracture with routine healing, subsequent encounter    Continue as she is doing.  Try to be active.  Return in one month.  Call if any problem.  Precautions discussed.  Electronically Signed Sanjuana Kava, MD 2/16/20238:46 AM

## 2021-11-30 ENCOUNTER — Encounter: Payer: Self-pay | Admitting: Orthopaedic Surgery

## 2021-11-30 ENCOUNTER — Ambulatory Visit (INDEPENDENT_AMBULATORY_CARE_PROVIDER_SITE_OTHER): Payer: Medicare HMO | Admitting: Orthopaedic Surgery

## 2021-11-30 ENCOUNTER — Other Ambulatory Visit: Payer: Self-pay

## 2021-11-30 VITALS — BP 152/91 | HR 77 | Ht 66.0 in | Wt 193.0 lb

## 2021-11-30 DIAGNOSIS — M8448XD Pathological fracture, other site, subsequent encounter for fracture with routine healing: Secondary | ICD-10-CM | POA: Diagnosis not present

## 2021-11-30 DIAGNOSIS — M8000XD Age-related osteoporosis with current pathological fracture, unspecified site, subsequent encounter for fracture with routine healing: Secondary | ICD-10-CM | POA: Diagnosis not present

## 2021-11-30 NOTE — Progress Notes (Signed)
I feel better. ? ?She is getting around better. She has less pain.  She uses the walker in public but not around the house.  She has increased her activity. She has no weakness. ? ?Her back is without pain.  Gait is good with and without the walker.  NV intact. ? ?Encounter Diagnoses  ?Name Primary?  ? Sacral insufficiency fracture with routine healing, subsequent encounter Yes  ? Age-related osteoporosis with current pathological fracture with routine healing, subsequent encounter   ? ?Gradually increase activity more. ? ?Return in six weeks. ? ?Call if any problem. ? ?Precautions discussed. ? ?Electronically Signed ?Sanjuana Kava, MD ?3/16/20239:21 AM ? ?

## 2022-01-01 IMAGING — MR MR LUMBAR SPINE W/O CM
5 of 6 series · 35 of 48 positions shown · non-contrast
Comparison: X-ray lumbar 07/06/2021.

CLINICAL DATA: Low back pain, trauma.

EXAM:
MRI LUMBAR SPINE WITHOUT CONTRAST
TECHNIQUE: Multiplanar, multisequence MR imaging of the lumbar spine was
performed. No intravenous contrast was administered.

[Series 13: T2 · sagittal · 4.0mm · 0.68mm/px · 6 of 19 slices shown (1 of 3)]
[im 1/19]
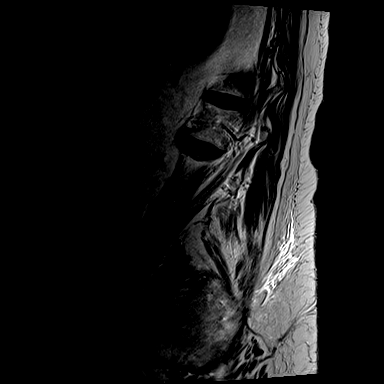
[im 4/19]
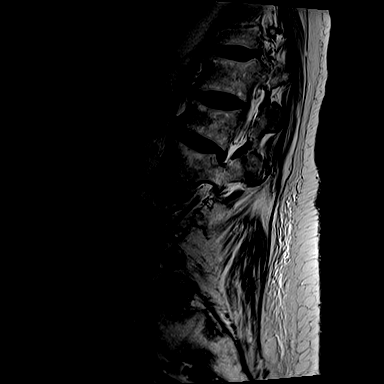
[im 8/19]
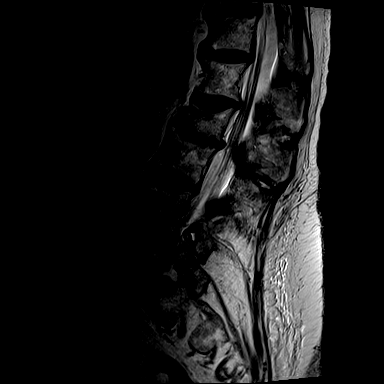
[im 11/19]
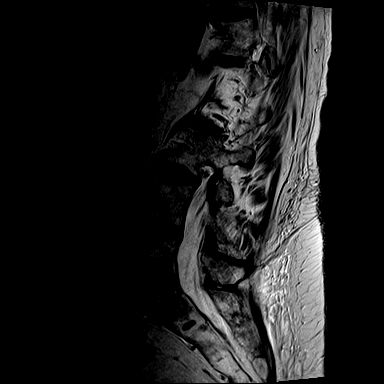
[im 15/19]
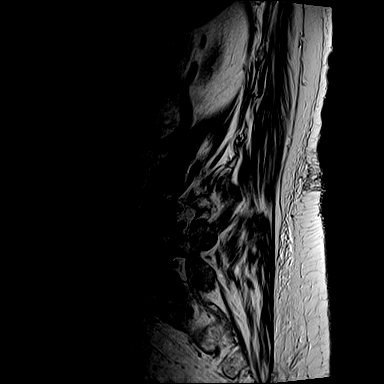
[im 19/19]
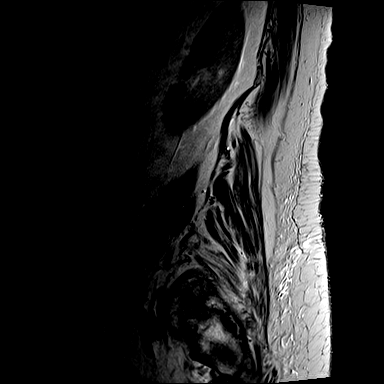

[Series 14: T1 · sagittal · 4.0mm · 0.81mm/px · 6 of 19 slices shown (1 of 2)]
[im 1/19]
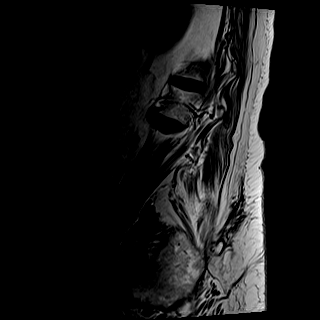
[im 4/19]
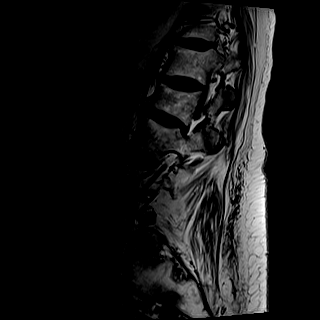
[im 8/19]
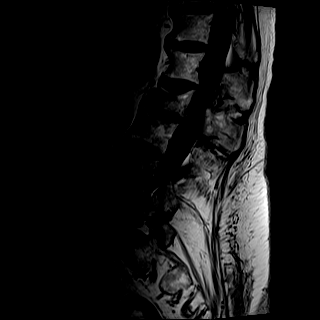
[im 11/19]
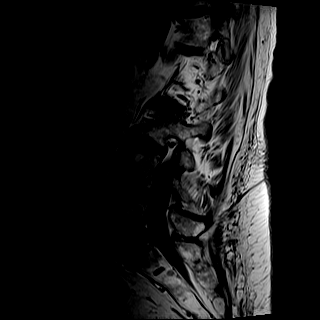
[im 15/19]
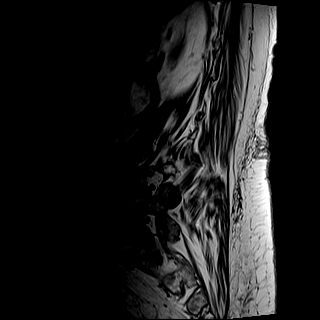
[im 19/19]
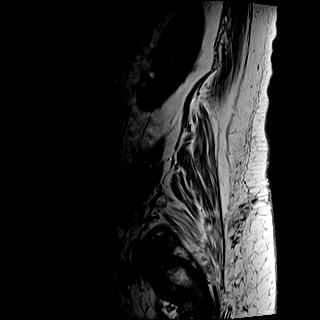

[Series 16: T2 · axial · 4.0mm · 0.70mm/px · z∈[-27,+159]mm · 11 of 31 slices shown (2 of 3)]
[im 1/31]
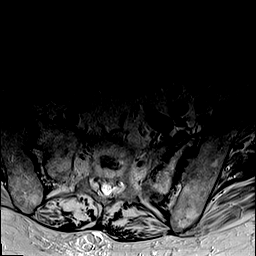
[im 4/31]
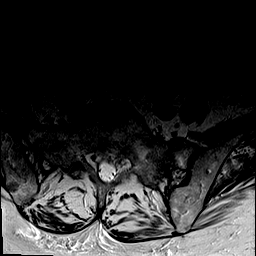
[im 7/31]
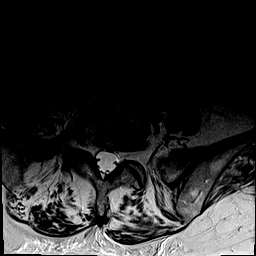
[im 10/31]
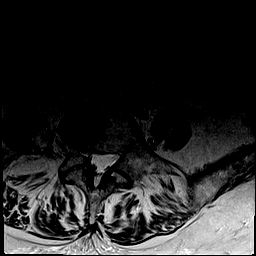
[im 13/31]
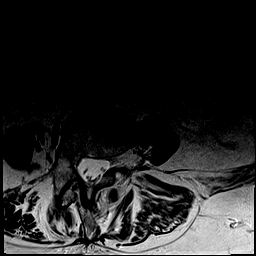
[im 16/31]
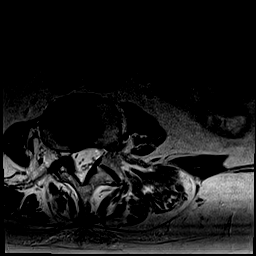
[im 19/31]
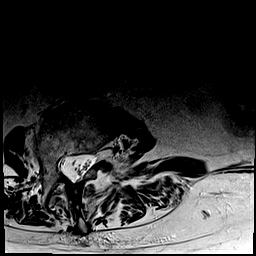
[im 22/31]
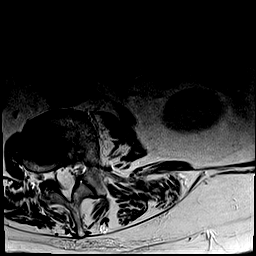
[im 25/31]
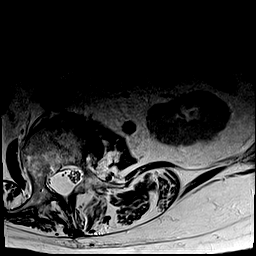
[im 28/31]
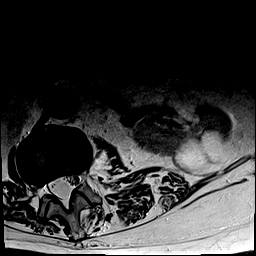
[im 31/31]
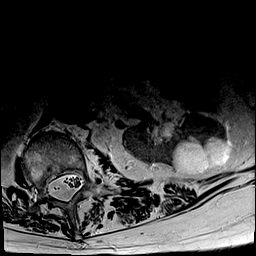

[Series 17: T1 · axial · 4.0mm · 0.35mm/px · z∈[-27,+100]mm · 5 of 31 slices shown (2 of 2)]
[im 1/31]
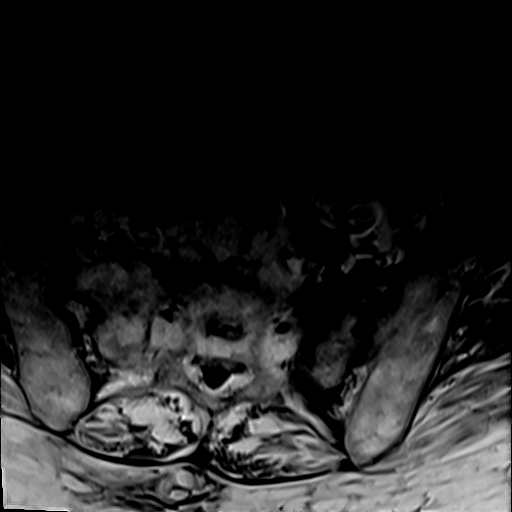
[im 7/31]
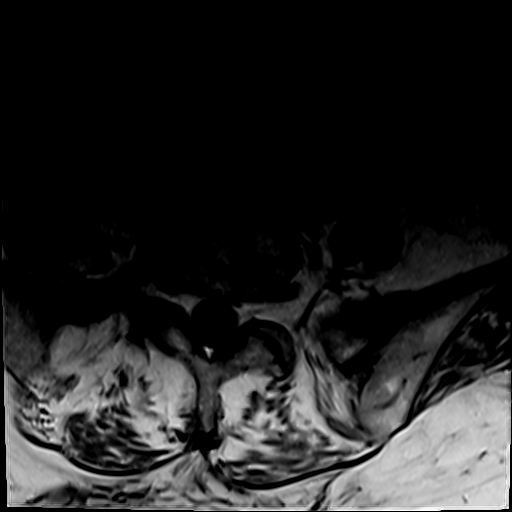
[im 10/31]
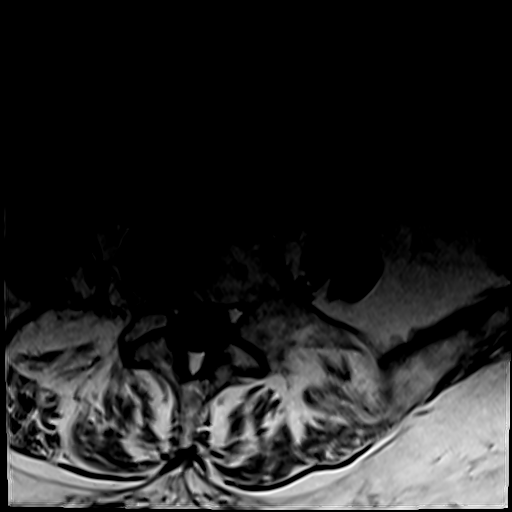
[im 13/31]
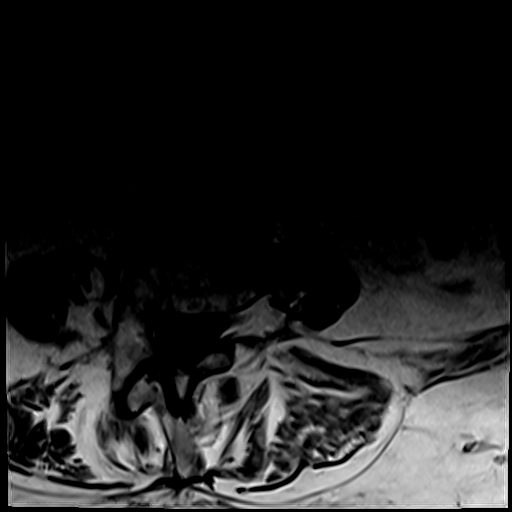
[im 19/31]
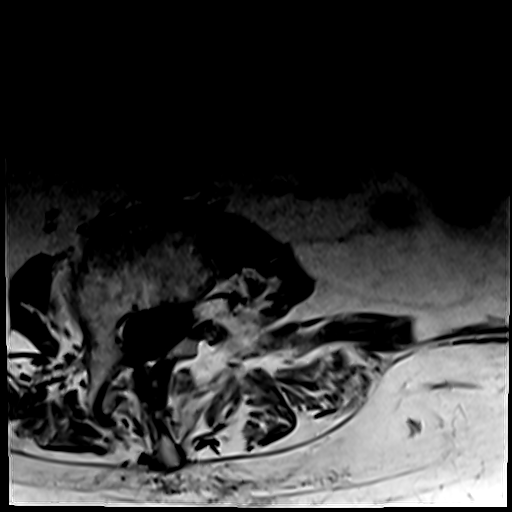

[Series 18: T2 · coronal · 4.0mm · 0.68mm/px · 7 of 21 slices shown (3 of 3)]
[im 1/21]
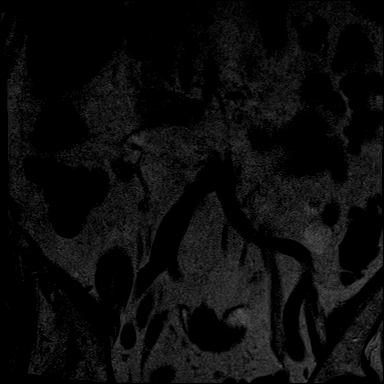
[im 4/21]
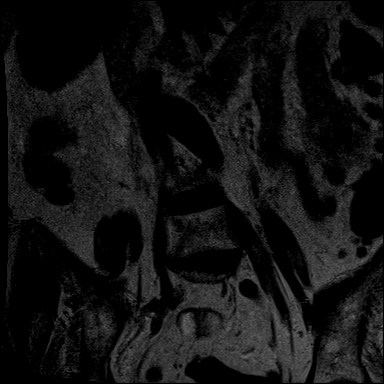
[im 7/21]
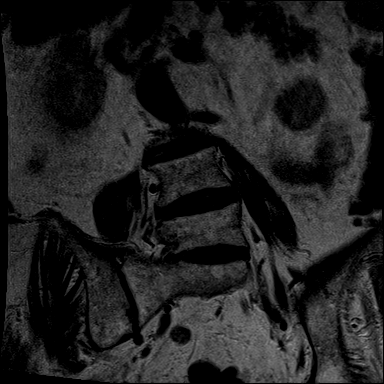
[im 11/21]
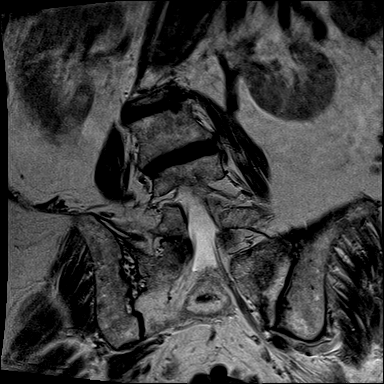
[im 14/21]
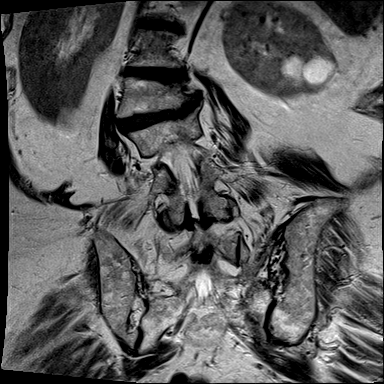
[im 17/21]
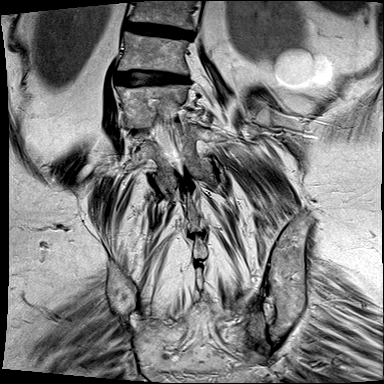
[im 21/21]
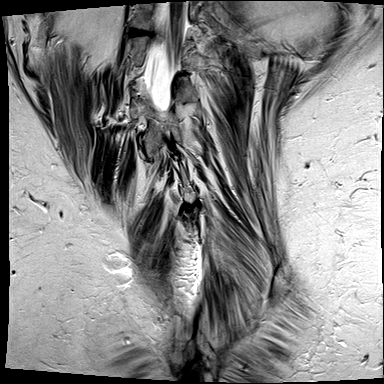

[35 of 48 positions shown; findings below may reference images not displayed]

FINDINGS: Segmentation:  Standard.

Alignment:  Dextroscoliosis of the thoracolumbar spine.

Vertebrae: No acute fracture, evidence of discitis, or aggressive
bone lesion. Chronic L2 vertebral body compression fracture.
Partially visualized, left sacral insufficiency fracture with
surrounding bone marrow edema.

Conus medullaris and cauda equina: Conus extends to the T12-L1
level. Conus and cauda equina appear normal.

Paraspinal and other soft tissues: No acute paraspinal abnormality.

Disc levels:

Disc spaces: Degenerative disease with disc desiccation throughout
the lumbar spine. Disc height loss at L2-3.

T12-L1: Mild broad-based disc bulge. No foraminal or central canal
stenosis.

L1-L2: Mild broad-based disc bulge. No foraminal or central canal
stenosis.

L2-L3: Broad-based disc osteophyte complex. Moderate left foraminal
stenosis. No right foraminal stenosis. No central canal stenosis.

L3-L4: Mild broad-based disc bulge. Mild bilateral facet
arthropathy. Mild right foraminal stenosis. No left foraminal
stenosis. No central canal stenosis.

L4-L5: Mild broad-based disc bulge. No foraminal or central canal
stenosis. Mild bilateral facet arthropathy.

L5-S1: Mild broad-based disc bulge. Mild bilateral facet
arthropathy. No central canal or foraminal stenosis.
IMPRESSION: 1. Lumbar spine spondylosis as described above.
2. Partially visualized, left sacral insufficiency fracture with
surrounding bone marrow edema.
3. No acute osseous injury of the lumbar spine.

## 2022-01-11 ENCOUNTER — Ambulatory Visit: Payer: Medicare HMO | Admitting: Orthopaedic Surgery

## 2022-01-11 ENCOUNTER — Encounter: Payer: Self-pay | Admitting: Orthopaedic Surgery

## 2022-01-11 DIAGNOSIS — M8000XD Age-related osteoporosis with current pathological fracture, unspecified site, subsequent encounter for fracture with routine healing: Secondary | ICD-10-CM | POA: Diagnosis not present

## 2022-01-11 DIAGNOSIS — M8448XD Pathological fracture, other site, subsequent encounter for fracture with routine healing: Secondary | ICD-10-CM

## 2022-01-11 NOTE — Progress Notes (Signed)
I feel much better. ? ?She is doing well.  She still uses her walker and still has some lower back pain but overall is much improved. She has no new trauma.  She has no weakness. ? ?Spine/Pelvis examination: ? Inspection:  Overall, sacoiliac joint benign and hips nontender; without crepitus or defects. ? ? Thoracic spine inspection: Alignment normal without kyphosis present ? ? Lumbar spine inspection:  Alignment  with normal lumbar lordosis, without scoliosis apparent. ? ? Thoracic spine palpation:  without tenderness of spinal processes ? ? Lumbar spine palpation: without tenderness of lumbar area; without tightness of lumbar muscles  ? ? Range of Motion: ?  Lumbar flexion, forward flexion is normal without pain or tenderness  ?  Lumbar extension is full without pain or tenderness ?  Left lateral bend is normal without pain or tenderness ?  Right lateral bend is normal without pain or tenderness ?  Straight leg raising is normal ? Strength & tone: normal ? ? Stability overall normal stability ? ?Encounter Diagnoses  ?Name Primary?  ? Sacral insufficiency fracture with routine healing, subsequent encounter Yes  ? Age-related osteoporosis with current pathological fracture with routine healing, subsequent encounter   ? ?I will see prn. ? ?Call if any problem. ? ?Precautions discussed. ? ?Electronically Signed ?Darreld Mclean, MD ?4/27/202310:14 AM ? ?

## 2022-02-01 ENCOUNTER — Encounter: Payer: Self-pay | Admitting: Nurse Practitioner

## 2022-02-01 ENCOUNTER — Ambulatory Visit (INDEPENDENT_AMBULATORY_CARE_PROVIDER_SITE_OTHER): Payer: Medicare HMO | Admitting: Nurse Practitioner

## 2022-02-01 VITALS — BP 168/90 | HR 85 | Ht 61.0 in | Wt 196.0 lb

## 2022-02-01 DIAGNOSIS — R7303 Prediabetes: Secondary | ICD-10-CM | POA: Diagnosis not present

## 2022-02-01 DIAGNOSIS — E039 Hypothyroidism, unspecified: Secondary | ICD-10-CM

## 2022-02-01 DIAGNOSIS — M8000XS Age-related osteoporosis with current pathological fracture, unspecified site, sequela: Secondary | ICD-10-CM | POA: Diagnosis not present

## 2022-02-01 DIAGNOSIS — Z139 Encounter for screening, unspecified: Secondary | ICD-10-CM | POA: Diagnosis not present

## 2022-02-01 DIAGNOSIS — I10 Essential (primary) hypertension: Secondary | ICD-10-CM | POA: Diagnosis not present

## 2022-02-01 NOTE — Assessment & Plan Note (Signed)
Lab Results  Component Value Date   TSH 4.330 08/03/2021  Currently not on medication Patient refused lab work today

## 2022-02-01 NOTE — Assessment & Plan Note (Addendum)
Getting her calcium from foods, taking vitamin D2000 units daily Refused treatment with fosamax.  Fall prevent education given.  Continue calcium intake and vitamin D 2000 units daily

## 2022-02-01 NOTE — Progress Notes (Signed)
   Natasha Willis     MRN: [id number]      DOB: 09/14/1941   HPI [name] with past medical history of essential hypertension, hypothyroidism, prediabetes, osteoporosis is here for follow up and re-evaluation of chronic medical conditions.    Hypertension.  Currently not on medication.  Patient reports that she has been monitoring her blood pressure at home and that her blood pressure has been normal at home.  States that whenever she is at the doctors offices her blood pressure goes up.  Patient's daughter who is a CNA accompanied the  patient to the clinic  today and reports that she has been checking the patient's  blood pressure at home and that her  systolic blood pressure has always been in the 120s.  Patient denies chest pain, headaches, dizziness, syncope    Due for shingles vaccine, Tdap vaccine, pneumonia vaccine , need for all vaccines discussed with patient , patient stated that she will not get the vaccines and that she has always been healthy    ROS Denies recent fever or chills. Denies sinus pressure, nasal congestion, ear pain or sore throat. Denies chest congestion, productive cough or wheezing. Denies chest pains, palpitations and leg swelling Denies abdominal pain, nausea, vomiting,diarrhea or constipation.   Denies dysuria, frequency, hesitancy or incontinence. Denies joint pain, swelling and limitation in mobility. Denies depression, anxiety or insomnia.   PE  BP (!) 170/92 (BP Location: Right Arm, Cuff Size: Large)   Pulse 85   Ht 5\' 1"  (1.549 m)   Wt 196 lb (88.9 kg)   SpO2 93%   BMI 37.03 kg/m   Patient alert and oriented and in no cardiopulmonary distress.  Chest: Clear to auscultation bilaterally.  CVS: S1, S2 no murmurs, no S3.Regular rate.  ABD: Soft non tender.   Ext: No edema  MS: Adequate ROM spine, shoulders, hips and knees.  Psych: Good eye contact, normal affect. Memory intact not anxious or depressed appearing.  CNS: CN 2-12  intact, power,  normal throughout.no focal deficits noted.   Assessment & Plan  Essential hypertension BP Readings from Last 3 Encounters:  02/01/22 (!) 168/90  11/30/21 (!) 152/91  08/03/21 138/88  Patient states that she has whitecoat syndrome, currently not on medications 120/62, pt daughter has been monitoring her at home daily.  Patient encouraged to continue to monitor blood ppressure ressure bring her blood pressure monitor to her next follow-up appointment.  DASH diet advised engage regularly as tolerated at least 150 minutes weekly.   Osteoporosis Getting her calcium from foods, taking vitamin D2000 units daily Refused treatment with fosamax.  Fall prevent education given.  Continue calcium intake and vitamin D 2000 units daily  Prediabetes Lab Results  Component Value Date   HGBA1C 5.7 (H) 02/28/2021  check A1C  Avoid sugar sweets soda    Hypothyroidism Lab Results  Component Value Date   TSH 4.330 08/03/2021  Currently not on medication Patient refused lab work today

## 2022-02-01 NOTE — Assessment & Plan Note (Addendum)
BP Readings from Last 3 Encounters:  02/01/22 (!) 168/90  11/30/21 (!) 152/91  08/03/21 138/88  Patient states that she has whitecoat syndrome, currently not on medications 120/62, pt daughter has been monitoring her at home daily.  Patient encouraged to continue to monitor blood ppressure ressure bring her blood pressure monitor to her next follow-up appointment.  DASH diet advised engage regularly as tolerated at least 150 minutes weekly.

## 2022-02-01 NOTE — Patient Instructions (Addendum)
Please continue to monitor your blood pressure at home call the office if your blood pressure stay above 140/90, please bring your blood pressure monitor with you to your next appointment.      It is important that you exercise regularly at least 30 minutes 5 times a week.  Think about what you will eat, plan ahead. Choose " clean, green, fresh or frozen" over canned, processed or packaged foods which are more sugary, salty and fatty. 70 to 75% of food eaten should be vegetables and fruit. Three meals at set times with snacks allowed between meals, but they must be fruit or vegetables. Aim to eat over a 12 hour period , example 7 am to 7 pm, and STOP after  your last meal of the day. Drink water,generally about 64 ounces per day, no other drink is as healthy. Fruit juice is best enjoyed in a healthy way, by EATING the fruit.  Thanks for choosing [facility], we consider it a privelige to serve you.

## 2022-02-01 NOTE — Assessment & Plan Note (Addendum)
Lab Results  Component Value Date   HGBA1C 5.7 (H) 02/28/2021  check A1C  Avoid sugar sweets soda

## 2022-02-01 NOTE — Assessment & Plan Note (Deleted)
Lab Results  Component Value Date   HGBA1C 5.7 (H) 02/28/2021  Checking A1c today Avoid sugar sweets soda

## 2022-02-02 ENCOUNTER — Other Ambulatory Visit: Payer: Self-pay | Admitting: Nurse Practitioner

## 2022-02-02 LAB — CBC WITH DIFFERENTIAL/PLATELET
Basophils Absolute: 0.1 10*3/uL (ref 0.0–0.2)
Basos: 1 %
EOS (ABSOLUTE): 0.2 10*3/uL (ref 0.0–0.4)
Eos: 3 %
Hematocrit: 46.2 % (ref 34.0–46.6)
Hemoglobin: 15.5 g/dL (ref 11.1–15.9)
Immature Grans (Abs): 0 10*3/uL (ref 0.0–0.1)
Immature Granulocytes: 0 %
Lymphocytes Absolute: 1.1 10*3/uL (ref 0.7–3.1)
Lymphs: 21 %
MCH: 29.9 pg (ref 26.6–33.0)
MCHC: 33.5 g/dL (ref 31.5–35.7)
MCV: 89 fL (ref 79–97)
Monocytes Absolute: 0.7 10*3/uL (ref 0.1–0.9)
Monocytes: 13 %
Neutrophils Absolute: 3.2 10*3/uL (ref 1.4–7.0)
Neutrophils: 62 %
Platelets: 280 10*3/uL (ref 150–450)
RBC: 5.19 x10E6/uL (ref 3.77–5.28)
RDW: 14.8 % (ref 11.7–15.4)
WBC: 5.1 10*3/uL (ref 3.4–10.8)

## 2022-02-02 LAB — LIPID PANEL
Chol/HDL Ratio: 3.4 ratio (ref 0.0–4.4)
Cholesterol, Total: 181 mg/dL (ref 100–199)
HDL: 54 mg/dL (ref >39)
LDL Chol Calc (NIH): 105 mg/dL — ABNORMAL HIGH (ref 0–99)
Triglycerides: 121 mg/dL (ref 0–149)
VLDL Cholesterol Cal: 22 mg/dL (ref 5–40)

## 2022-02-02 LAB — CMP14+EGFR
ALT: 16 IU/L (ref 0–32)
AST: 26 IU/L (ref 0–40)
Albumin/Globulin Ratio: 1.6 (ref 1.2–2.2)
Albumin: 4.4 g/dL (ref 3.7–4.7)
Alkaline Phosphatase: 140 IU/L — ABNORMAL HIGH (ref 44–121)
BUN/Creatinine Ratio: 21 (ref 12–28)
BUN: 11 mg/dL (ref 8–27)
Bilirubin Total: 0.7 mg/dL (ref 0.0–1.2)
CO2: 25 mmol/L (ref 20–29)
Calcium: 10.9 mg/dL — ABNORMAL HIGH (ref 8.7–10.3)
Chloride: 103 mmol/L (ref 96–106)
Creatinine, Ser: 0.52 mg/dL — ABNORMAL LOW (ref 0.57–1.00)
Globulin, Total: 2.7 g/dL (ref 1.5–4.5)
Glucose: 109 mg/dL — ABNORMAL HIGH (ref 70–99)
Potassium: 4.7 mmol/L (ref 3.5–5.2)
Sodium: 142 mmol/L (ref 134–144)
Total Protein: 7.1 g/dL (ref 6.0–8.5)
eGFR: 94 mL/min/{1.73_m2} (ref >59)

## 2022-02-02 LAB — HEMOGLOBIN A1C
Est. average glucose Bld gHb Est-mCnc: 120 mg/dL
Hgb A1c MFr Bld: 5.8 % — ABNORMAL HIGH (ref 4.8–5.6)

## 2022-08-06 ENCOUNTER — Encounter: Payer: Medicare HMO | Admitting: Nurse Practitioner

## 2022-08-07 ENCOUNTER — Ambulatory Visit (INDEPENDENT_AMBULATORY_CARE_PROVIDER_SITE_OTHER): Payer: Medicare HMO | Admitting: Family Medicine

## 2022-08-07 ENCOUNTER — Encounter: Payer: Self-pay | Admitting: Family Medicine

## 2022-08-07 ENCOUNTER — Encounter: Payer: Medicare HMO | Admitting: Nurse Practitioner

## 2022-08-07 VITALS — BP 136/60 | HR 83 | Ht 62.0 in | Wt 186.1 lb

## 2022-08-07 DIAGNOSIS — E7849 Other hyperlipidemia: Secondary | ICD-10-CM

## 2022-08-07 DIAGNOSIS — R7301 Impaired fasting glucose: Secondary | ICD-10-CM

## 2022-08-07 DIAGNOSIS — E559 Vitamin D deficiency, unspecified: Secondary | ICD-10-CM | POA: Diagnosis not present

## 2022-08-07 DIAGNOSIS — E038 Other specified hypothyroidism: Secondary | ICD-10-CM | POA: Diagnosis not present

## 2022-08-07 DIAGNOSIS — Z0001 Encounter for general adult medical examination with abnormal findings: Secondary | ICD-10-CM | POA: Diagnosis not present

## 2022-08-07 NOTE — Assessment & Plan Note (Signed)
Physical exam as documented Counseling is done on healthy lifestyle involving commitment to 150 minutes of exercise per week,  Discussed heart-healthy diet  Changes in health habits are decided on by the patient with goals and time frames set for achieving them. Immunization and cancer screening needs are specifically addressed at this visit     

## 2022-08-07 NOTE — Progress Notes (Signed)
Complete physical exam  Patient: Natasha Willis   DOB: 1941/06/01   81 y.o. Female  MRN: [id number]  Subjective:    Chief Complaint  Patient presents with   Annual Exam    Cpe today. Would like to have iron/calcium levels checked, pt reports sciatic nerve pain.     Natasha Willis is a 81 y.o. female who presents today for a complete physical exam. She reports consuming a general diet.  She reports walking for 30 minutes daily. She generally feels well. She reports sleeping well. She does not have additional problems to discuss today.    Most recent fall risk assessment:    08/07/2022    2:47 PM  [place] in the past year? 0  Number falls in past yr: 0  Injury with Fall? 0  Risk for fall due to : No Fall Risks  Follow up Falls evaluation completed     Most recent depression screenings:    08/07/2022    2:47 PM 02/01/2022    3:22 PM  PHQ 2/9 Scores  PHQ - 2 Score 0 0  PHQ- 9 Score 0     Vision:Within last year  Patient Active Problem List   Diagnosis Date Noted   Osteoporosis 08/03/2021   Fall 07/04/2021   Sciatica 02/28/2021   Impaired fasting glucose 10/26/2020   Hypothyroidism 01/04/2020   Essential hypertension 01/04/2020   Diverticulitis 06/22/2019   Prediabetes 06/22/2019   Allergies 06/22/2019   Encounter for general adult medical examination with abnormal findings 06/22/2019   Past Medical History:  Diagnosis Date   Asthma    Diverticulitis    Hypertension    Thyroid disease    Past Surgical History:  Procedure Laterality Date   ABDOMINAL HYSTERECTOMY     ANTERIOR AND POSTERIOR REPAIR     APPENDECTOMY     CATARACT EXTRACTION W/PHACO Right 08/19/2014   Procedure: CATARACT EXTRACTION PHACO AND INTRAOCULAR LENS PLACEMENT RIGHT EYE;  Surgeon: Tonny Branch, MD;  Location: [facility];  Service: Ophthalmology;  Laterality: Right;  CDE:7.60   CATARACT EXTRACTION W/PHACO Left 08/30/2014   Procedure: CATARACT EXTRACTION PHACO AND INTRAOCULAR  LENS PLACEMENT (IOC);  Surgeon: Tonny Branch, MD;  Location: [facility];  Service: Ophthalmology;  Laterality: Left;  CDE:5.49   CHOLECYSTECTOMY     TUBAL LIGATION     Social History   Tobacco Use   Smoking status: Never   Smokeless tobacco: Never  Substance Use Topics   Alcohol use: No   Drug use: No   Social History   Socioeconomic History   Marital status: Legally Separated    Spouse name: Not on file   Number of children: 5   Years of education: Not on file   Highest education level: Not on file  Occupational History   Not on file  Tobacco Use   Smoking status: Never   Smokeless tobacco: Never  Substance and Sexual Activity   Alcohol use: No   Drug use: No   Sexual activity: Not Currently    Birth control/protection: Surgical  Other Topics Concern   Not on file  Social History Narrative   Lives alone   5 children, one lives 2 doors down.   [place], [employer], and 2 in [place]      Cat: Frankey Poot      Enjoys: reading-bible, christian books      Diet: strict diet (avoid sugar)   Caffeine: tea daily   Water: 6-8 cups  Wears seat belt- does not driving, daughter drives her    Oceanographer at home   No weapons   Social Determinants of Health   Financial Resource Strain: Low Risk  (03/24/2020)   Overall Financial Resource Strain (CARDIA)    Difficulty of Paying Living Expenses: Not hard at all  Food Insecurity: No Food Insecurity (03/24/2020)   Hunger Vital Sign    Worried About Running Out of Food in the Last Year: Never true    Ran Out of Food in the Last Year: Never true  Transportation Needs: No Transportation Needs (03/24/2020)   PRAPARE - Hydrologist (Medical): No    Lack of Transportation (Non-Medical): No  Physical Activity: Inactive (03/24/2020)   Exercise Vital Sign    Days of Exercise per Week: 0 days    Minutes of Exercise per Session: 0 min  Stress: No Stress Concern Present (03/24/2020)   [place]    Feeling of Stress : Not at all  Social Connections: Moderately Isolated (03/24/2020)   Social Connection and Isolation Panel [NHANES]    Frequency of Communication with Friends and Family: More than three times a week    Frequency of Social Gatherings with Friends and Family: More than three times a week    Attends Religious Services: More than 4 times per year    Active Member of [employer] or Organizations: No    Attends Archivist Meetings: Never    Marital Status: Widowed  Intimate Partner Violence: Not At Risk (03/24/2020)   Humiliation, Afraid, Rape, and Kick questionnaire    Fear of Current or Ex-Partner: No    Emotionally Abused: No    Physically Abused: No    Sexually Abused: No   Family Status  Relation Name Status   Brother  Deceased   Brother  Alive   Family History  Problem Relation Age of Onset   Diabetes Brother    Heart disease Brother    Diabetes Brother    Heart disease Brother    Allergies  Allergen Reactions   Penicillins       Patient Care Team: Alvira Monday, FNP as PCP - General (Family Medicine)   Outpatient Medications Prior to Visit  Medication Sig   carboxymethylcellulose 1 % ophthalmic solution 1 drop 3 (three) times daily.   Cholecalciferol (VITAMIN D) 50 MCG (2000 UT) CAPS Take by mouth. Take one capsule daily   Fish Oil-Cholecalciferol (FISH OIL + D3) 1000-1000 MG-UNIT CAPS Take 1 capsule by mouth daily.    Multiple Vitamins-Minerals (CENTRAL-VITE FOR SENIORS PO) Take 1 tablet by mouth daily.    naproxen (NAPROSYN) 500 MG tablet Take 1 tablet (500 mg total) by mouth 2 (two) times daily as needed.   No facility-administered medications prior to visit.    Review of Systems  Constitutional:  Negative for fever and malaise/fatigue.  HENT:  Negative for congestion and sinus pain.   Eyes:  Negative for discharge.  Respiratory:  Negative for cough and shortness of breath.   Cardiovascular:   Negative for orthopnea.  Gastrointestinal:  Negative for nausea and vomiting.  Genitourinary:  Negative for dysuria, frequency and urgency.  Skin:  Negative for itching.  Neurological:  Negative for dizziness and headaches.  Psychiatric/Behavioral:  Negative for depression and suicidal ideas. The patient does not have insomnia.        Objective:    BP 136/60 (BP Location: Left Arm)  Pulse 83   Ht _0  (1.575 m)   Wt 186 lb 1.9 oz (84.4 kg)   SpO2 94%   BMI 34.04 kg/m  BP Readings from Last 3 Encounters:  08/07/22 136/60  02/01/22 (!) 168/90  11/30/21 (!) 152/91   Wt Readings from Last 3 Encounters:  08/07/22 186 lb 1.9 oz (84.4 kg)  02/01/22 196 lb (88.9 kg)  11/30/21 193 lb (87.5 kg)      Physical Exam HENT:     Head: Normocephalic.     Right Ear: External ear normal.     Left Ear: External ear normal.     Nose: No congestion.     Mouth/Throat:     Mouth: Mucous membranes are moist.  Eyes:     Extraocular Movements: Extraocular movements intact.     Pupils: Pupils are equal, round, and reactive to light.  Cardiovascular:     Rate and Rhythm: Normal rate and regular rhythm.     Pulses: Normal pulses.     Heart sounds: Normal heart sounds.  Pulmonary:     Effort: Pulmonary effort is normal.     Breath sounds: Normal breath sounds. No wheezing.  Abdominal:     Palpations: Abdomen is soft.     Tenderness: There is no right CVA tenderness or left CVA tenderness.  Musculoskeletal:     Right lower leg: No edema.     Left lower leg: No edema.  Skin:    Findings: No lesion.  Neurological:     Mental Status: She is alert and oriented to person, place, and time.     Coordination: Finger-Nose-Finger Test normal.     Gait: Gait normal.  Psychiatric:     Comments: Normal affect     No results found for any visits on 08/07/22. Last CBC Lab Results  Component Value Date   WBC 5.1 02/01/2022   HGB 15.5 02/01/2022   HCT 46.2 02/01/2022   MCV 89 02/01/2022    MCH 29.9 02/01/2022   RDW 14.8 02/01/2022   PLT 280 03/88/8280   Last metabolic panel Lab Results  Component Value Date   GLUCOSE 109 (H) 02/01/2022   NA 142 02/01/2022   K 4.7 02/01/2022   CL 103 02/01/2022   CO2 25 02/01/2022   BUN 11 02/01/2022   CREATININE 0.52 (L) 02/01/2022   EGFR 94 02/01/2022   CALCIUM 10.9 (H) 02/01/2022   PROT 7.1 02/01/2022   ALBUMIN 4.4 02/01/2022   LABGLOB 2.7 02/01/2022   AGRATIO 1.6 02/01/2022   BILITOT 0.7 02/01/2022   ALKPHOS 140 (H) 02/01/2022   AST 26 02/01/2022   ALT 16 02/01/2022   ANIONGAP 11 08/17/2014   Last lipids Lab Results  Component Value Date   CHOL 181 02/01/2022   HDL 54 02/01/2022   LDLCALC 105 (H) 02/01/2022   TRIG 121 02/01/2022   CHOLHDL 3.4 02/01/2022   Last hemoglobin A1c Lab Results  Component Value Date   HGBA1C 5.8 (H) 02/01/2022   Last thyroid functions Lab Results  Component Value Date   TSH 4.330 08/03/2021   Last vitamin D Lab Results  Component Value Date   VD25OH 35.5 08/03/2021   Last vitamin B12 and Folate No results found for: "VITAMINB12", "FOLATE"      Assessment & Plan:    Routine Health Maintenance and Physical Exam   There is no immunization history on file for this patient.  Health Maintenance  Topic Date Due   Medicare Annual Wellness (AWV)  Never  done   Zoster Vaccines- Shingrix (1 of 2) 11/07/2022 (Originally 03/26/1992)   INFLUENZA VACCINE  12/16/2022 (Originally 04/17/2022)   Pneumonia Vaccine 86+ Years old (1 - PCV) 02/02/2023 (Originally 03/27/2007)   DEXA SCAN  Completed   HPV VACCINES  Aged Out   COVID-19 Vaccine  Discontinued    Discussed health benefits of physical activity, and encouraged her to engage in regular exercise appropriate for her age and condition.  Encounter for general adult medical examination with abnormal findings Assessment & Plan: Physical exam as documented Counseling is done on healthy lifestyle involving commitment to 150 minutes of  exercise per week,  Discussed heart-healthy diet Changes in health habits are decided on by the patient with goals and time frames set for achieving them. Immunization and cancer screening needs are specifically addressed at this visit       IFG (impaired fasting glucose) -     Hemoglobin A1c  Vitamin D deficiency -     VITAMIN D 25 Hydroxy (Vit-D Deficiency, Fractures)  Other specified hypothyroidism -     TSH + free T4  Other hyperlipidemia -     Lipid panel -     CMP14+EGFR -     CBC with Differential/Platelet    Return in about 5 months (around 01/06/2023).     Alvira Monday, FNP

## 2022-08-07 NOTE — Patient Instructions (Addendum)
I appreciate the opportunity to provide care to you today!    Follow up:  5 months  Labs: please stop by the lab today to get your blood drawn (CBC, CMP, TSH, Lipid profile, HgA1c, Vit D)     Please continue to a heart-healthy diet and increase your physical activities. Try to exercise for 30mins at least three times a week.      It was a pleasure to see you and I look forward to continuing to work together on your health and well-being. Please do not hesitate to call the office if you need care or have questions about your care.   Have a wonderful day and week. With Gratitude, Charles Niese MSN, FNP-BC  

## 2022-08-08 LAB — CBC WITH DIFFERENTIAL/PLATELET
Basophils Absolute: 0 10*3/uL (ref 0.0–0.2)
Basos: 1 %
EOS (ABSOLUTE): 0.2 10*3/uL (ref 0.0–0.4)
Eos: 3 %
Hematocrit: 45.2 % (ref 34.0–46.6)
Hemoglobin: 15.4 g/dL (ref 11.1–15.9)
Immature Grans (Abs): 0 10*3/uL (ref 0.0–0.1)
Immature Granulocytes: 0 %
Lymphocytes Absolute: 1.3 10*3/uL (ref 0.7–3.1)
Lymphs: 26 %
MCH: 30.3 pg (ref 26.6–33.0)
MCHC: 34.1 g/dL (ref 31.5–35.7)
MCV: 89 fL (ref 79–97)
Monocytes Absolute: 0.6 10*3/uL (ref 0.1–0.9)
Monocytes: 11 %
Neutrophils Absolute: 2.9 10*3/uL (ref 1.4–7.0)
Neutrophils: 59 %
Platelets: 285 10*3/uL (ref 150–450)
RBC: 5.08 x10E6/uL (ref 3.77–5.28)
RDW: 14.1 % (ref 11.7–15.4)
WBC: 4.9 10*3/uL (ref 3.4–10.8)

## 2022-08-08 LAB — CMP14+EGFR
ALT: 16 IU/L (ref 0–32)
AST: 25 IU/L (ref 0–40)
Albumin/Globulin Ratio: 1.4 (ref 1.2–2.2)
Albumin: 4.2 g/dL (ref 3.8–4.8)
Alkaline Phosphatase: 93 IU/L (ref 44–121)
BUN/Creatinine Ratio: 18 (ref 12–28)
BUN: 10 mg/dL (ref 8–27)
Bilirubin Total: 0.8 mg/dL (ref 0.0–1.2)
CO2: 23 mmol/L (ref 20–29)
Calcium: 10.6 mg/dL — ABNORMAL HIGH (ref 8.7–10.3)
Chloride: 103 mmol/L (ref 96–106)
Creatinine, Ser: 0.55 mg/dL — ABNORMAL LOW (ref 0.57–1.00)
Globulin, Total: 3.1 g/dL (ref 1.5–4.5)
Glucose: 103 mg/dL — ABNORMAL HIGH (ref 70–99)
Potassium: 4.5 mmol/L (ref 3.5–5.2)
Sodium: 141 mmol/L (ref 134–144)
Total Protein: 7.3 g/dL (ref 6.0–8.5)
eGFR: 93 mL/min/{1.73_m2} (ref >59)

## 2022-08-08 LAB — LIPID PANEL
Chol/HDL Ratio: 3.2 ratio (ref 0.0–4.4)
Cholesterol, Total: 174 mg/dL (ref 100–199)
HDL: 55 mg/dL (ref >39)
LDL Chol Calc (NIH): 103 mg/dL — ABNORMAL HIGH (ref 0–99)
Triglycerides: 85 mg/dL (ref 0–149)
VLDL Cholesterol Cal: 16 mg/dL (ref 5–40)

## 2022-08-08 LAB — TSH+FREE T4
Free T4: 1.02 ng/dL (ref 0.82–1.77)
TSH: 4.98 u[IU]/mL — ABNORMAL HIGH (ref 0.450–4.500)

## 2022-08-08 LAB — VITAMIN D 25 HYDROXY (VIT D DEFICIENCY, FRACTURES): Vit D, 25-Hydroxy: 53.8 ng/mL (ref 30.0–100.0)

## 2022-08-08 LAB — HEMOGLOBIN A1C
Est. average glucose Bld gHb Est-mCnc: 123 mg/dL
Hgb A1c MFr Bld: 5.9 % — ABNORMAL HIGH (ref 4.8–5.6)

## 2022-08-12 NOTE — Progress Notes (Signed)
Please inform the patient that she is prediabetic; I recommend a low intake of foods high in sugar.  Her kidneys and liver function are stable.  Her LDL is slightly elevated, and I recommend a low carbs and fat diet.

## 2022-10-24 DIAGNOSIS — E119 Type 2 diabetes mellitus without complications: Secondary | ICD-10-CM | POA: Diagnosis not present

## 2022-11-15 ENCOUNTER — Encounter: Payer: Self-pay | Admitting: Radiology

## 2022-12-07 ENCOUNTER — Ambulatory Visit (INDEPENDENT_AMBULATORY_CARE_PROVIDER_SITE_OTHER): Payer: Medicare HMO | Admitting: Family Medicine

## 2022-12-07 ENCOUNTER — Encounter: Payer: Self-pay | Admitting: Family Medicine

## 2022-12-07 VITALS — BP 128/68 | HR 94 | Ht 61.5 in | Wt 178.1 lb

## 2022-12-07 DIAGNOSIS — R7303 Prediabetes: Secondary | ICD-10-CM | POA: Diagnosis not present

## 2022-12-07 DIAGNOSIS — E7849 Other hyperlipidemia: Secondary | ICD-10-CM | POA: Diagnosis not present

## 2022-12-07 DIAGNOSIS — M5430 Sciatica, unspecified side: Secondary | ICD-10-CM | POA: Diagnosis not present

## 2022-12-07 DIAGNOSIS — E038 Other specified hypothyroidism: Secondary | ICD-10-CM

## 2022-12-07 DIAGNOSIS — E0789 Other specified disorders of thyroid: Secondary | ICD-10-CM

## 2022-12-07 DIAGNOSIS — I10 Essential (primary) hypertension: Secondary | ICD-10-CM

## 2022-12-07 NOTE — Assessment & Plan Note (Signed)
Chronic condition Declines pharmacological therapy today Reports that she does not like taking medications and will let me know if she needs pharmacological intervention

## 2022-12-07 NOTE — Progress Notes (Signed)
Established Patient Office Visit  Subjective:  Patient ID: Natasha Willis, female    DOB: May 23, 1941  Age: 82 y.o. MRN: [id number]  CC:  Chief Complaint  Patient presents with   Follow-up    4 month f/u, pt reports back pain, pains and aches. Pt states she went to my eye dr notes are in [facility] practitioner.    HPI Natasha Willis is a 82 y.o. female with past medical history of hypothyroidism, hypertension, and sciatica presents for f/u of  chronic medical conditions. For the details of today's visit, please refer to the assessment and plan.     Past Medical History:  Diagnosis Date   Asthma    Diverticulitis    Hypertension    Thyroid disease     Past Surgical History:  Procedure Laterality Date   ABDOMINAL HYSTERECTOMY     ANTERIOR AND POSTERIOR REPAIR     APPENDECTOMY     CATARACT EXTRACTION W/PHACO Right 08/19/2014   Procedure: CATARACT EXTRACTION PHACO AND INTRAOCULAR LENS PLACEMENT RIGHT EYE;  Surgeon: Tonny Branch, MD;  Location: [facility];  Service: Ophthalmology;  Laterality: Right;  CDE:7.60   CATARACT EXTRACTION W/PHACO Left 08/30/2014   Procedure: CATARACT EXTRACTION PHACO AND INTRAOCULAR LENS PLACEMENT (IOC);  Surgeon: Tonny Branch, MD;  Location: [facility];  Service: Ophthalmology;  Laterality: Left;  CDE:5.49   CHOLECYSTECTOMY     TUBAL LIGATION      Family History  Problem Relation Age of Onset   Diabetes Brother    Heart disease Brother    Diabetes Brother    Heart disease Brother     Social History   Socioeconomic History   Marital status: Legally Separated    Spouse name: Not on file   Number of children: 5   Years of education: Not on file   Highest education level: 10th grade  Occupational History   Not on file  Tobacco Use   Smoking status: Never   Smokeless tobacco: Never  Substance and Sexual Activity   Alcohol use: No   Drug use: No   Sexual activity: Not Currently    Birth control/protection: Surgical  Other Topics Concern   Not on  file  Social History Narrative   Lives alone   5 children, one lives 2 doors down.   [place], [employer], and 2 in [place]      Cat: Frankey Poot      Enjoys: reading-bible, christian books      Diet: strict diet (avoid sugar)   Caffeine: tea daily   Water: 6-8 cups       Wears seat belt- does not driving, daughter drives her    Oceanographer at home   No weapons   Social Determinants of Health   Financial Resource Strain: Low Risk  (12/05/2022)   Overall Financial Resource Strain (CARDIA)    Difficulty of Paying Living Expenses: Not hard at all  Food Insecurity: No Food Insecurity (12/05/2022)   Hunger Vital Sign    Worried About Running Out of Food in the Last Year: Never true    [place] in the Last Year: Never true  Transportation Needs: No Transportation Needs (12/05/2022)   PRAPARE - Hydrologist (Medical): No    Lack of Transportation (Non-Medical): No  Physical Activity: Sufficiently Active (12/05/2022)   Exercise Vital Sign    Days of Exercise per Week: 7 days    Minutes of Exercise per Session: 30 min  Stress: No Stress Concern Present (12/05/2022)   Exline    Feeling of Stress : Not at all  Social Connections: Unknown (12/05/2022)   Social Connection and Isolation Panel [NHANES]    Frequency of Communication with Friends and Family: More than three times a week    Frequency of Social Gatherings with Friends and Family: Twice a week    Attends Religious Services: More than 4 times per year    Active Member of [employer] or Organizations: No    Attends Music therapist: Not on file    Marital Status: Patient declined  Intimate Partner Violence: Not At Risk (03/24/2020)   Humiliation, Afraid, Rape, and Kick questionnaire    Fear of Current or Ex-Partner: No    Emotionally Abused: No    Physically Abused: No    Sexually Abused: No    Outpatient Medications Prior  to Visit  Medication Sig Dispense Refill   carboxymethylcellulose 1 % ophthalmic solution 1 drop 3 (three) times daily.     Cholecalciferol (VITAMIN D) 50 MCG (2000 UT) CAPS Take by mouth. Take one capsule daily     Fish Oil-Cholecalciferol (FISH OIL + D3) 1000-1000 MG-UNIT CAPS Take 1 capsule by mouth daily.      Multiple Vitamins-Minerals (CENTRAL-VITE FOR SENIORS PO) Take 1 tablet by mouth daily.      naproxen (NAPROSYN) 500 MG tablet Take 1 tablet (500 mg total) by mouth 2 (two) times daily as needed. 30 tablet 0   No facility-administered medications prior to visit.    Allergies  Allergen Reactions   Penicillins     ROS Review of Systems  Constitutional:  Negative for chills and fever.  Eyes:  Negative for visual disturbance.  Respiratory:  Negative for chest tightness and shortness of breath.   Musculoskeletal:  Positive for back pain.  Neurological:  Negative for dizziness and headaches.      Objective:    Physical Exam HENT:     Head: Normocephalic.     Mouth/Throat:     Mouth: Mucous membranes are moist.  Cardiovascular:     Rate and Rhythm: Normal rate.     Heart sounds: Normal heart sounds.  Pulmonary:     Effort: Pulmonary effort is normal.     Breath sounds: Normal breath sounds.  Musculoskeletal:     Lumbar back: Tenderness present. No signs of trauma. Normal range of motion.     Comments: scoliosis of the spine  Neurological:     Mental Status: She is alert.     BP 128/68 (BP Location: Left Arm)   Pulse 94   Ht 5' 1.5" (1.562 m)   Wt 178 lb 1.3 oz (80.8 kg)   SpO2 95%   BMI 33.10 kg/m  Wt Readings from Last 3 Encounters:  12/07/22 178 lb 1.3 oz (80.8 kg)  08/07/22 186 lb 1.9 oz (84.4 kg)  02/01/22 196 lb (88.9 kg)    Lab Results  Component Value Date   TSH 4.980 (H) 08/07/2022   Lab Results  Component Value Date   WBC 4.9 08/07/2022   HGB 15.4 08/07/2022   HCT 45.2 08/07/2022   MCV 89 08/07/2022   PLT 285 08/07/2022   Lab Results   Component Value Date   NA 141 08/07/2022   K 4.5 08/07/2022   CO2 23 08/07/2022   GLUCOSE 103 (H) 08/07/2022   BUN 10 08/07/2022   CREATININE 0.55 (L) 08/07/2022   BILITOT  0.8 08/07/2022   ALKPHOS 93 08/07/2022   AST 25 08/07/2022   ALT 16 08/07/2022   PROT 7.3 08/07/2022   ALBUMIN 4.2 08/07/2022   CALCIUM 10.6 (H) 08/07/2022   ANIONGAP 11 08/17/2014   EGFR 93 08/07/2022   Lab Results  Component Value Date   CHOL 174 08/07/2022   Lab Results  Component Value Date   HDL 55 08/07/2022   Lab Results  Component Value Date   LDLCALC 103 (H) 08/07/2022   Lab Results  Component Value Date   TRIG 85 08/07/2022   Lab Results  Component Value Date   CHOLHDL 3.2 08/07/2022   Lab Results  Component Value Date   HGBA1C 5.9 (H) 08/07/2022      Assessment & Plan:  Essential hypertension Assessment & Plan: Controlled Not on medications Encouraged lifestyle modification with low-sodium diet and increase physical activity BP Readings from Last 3 Encounters:  12/07/22 128/68  08/07/22 136/60  02/01/22 (!) 168/90      Sciatica, 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   Alvira Monday, FNP

## 2022-12-07 NOTE — Assessment & Plan Note (Signed)
Currently not on medications Will assess her thyroid levels today Lab Results  Component Value Date   TSH 4.980 (H) 08/07/2022

## 2022-12-07 NOTE — Patient Instructions (Addendum)
  I appreciate the opportunity to provide care to you today!    Follow up:  4 months  Labs: please stop by the lab today to get your blood drawn (CBC, CMP, TSH, Lipid profile, HgA1c, Vit D)  Please schedule Medicare Annual Wellness.   Please continue to a heart-healthy diet and increase your physical activities. Try to exercise for 26mins at least five days a week.      It was a pleasure to see you and I look forward to continuing to work together on your health and well-being. Please do not hesitate to call the office if you need care or have questions about your care.   Have a wonderful day and week. With Gratitude, Alvira Monday MSN, FNP-BC

## 2022-12-07 NOTE — Assessment & Plan Note (Signed)
Controlled Not on medications Encouraged lifestyle modification with low-sodium diet and increase physical activity BP Readings from Last 3 Encounters:  12/07/22 128/68  08/07/22 136/60  02/01/22 (!) 168/90

## 2022-12-08 LAB — CBC WITH DIFFERENTIAL/PLATELET
Basophils Absolute: 0.1 10*3/uL (ref 0.0–0.2)
Basos: 1 %
EOS (ABSOLUTE): 0.2 10*3/uL (ref 0.0–0.4)
Eos: 4 %
Hematocrit: 37.3 % (ref 34.0–46.6)
Hemoglobin: 11.9 g/dL (ref 11.1–15.9)
Immature Grans (Abs): 0 10*3/uL (ref 0.0–0.1)
Immature Granulocytes: 0 %
Lymphocytes Absolute: 1.1 10*3/uL (ref 0.7–3.1)
Lymphs: 18 %
MCH: 28.5 pg (ref 26.6–33.0)
MCHC: 31.9 g/dL (ref 31.5–35.7)
MCV: 89 fL (ref 79–97)
Monocytes Absolute: 0.8 10*3/uL (ref 0.1–0.9)
Monocytes: 13 %
Neutrophils Absolute: 3.9 10*3/uL (ref 1.4–7.0)
Neutrophils: 64 %
Platelets: 323 10*3/uL (ref 150–450)
RBC: 4.17 x10E6/uL (ref 3.77–5.28)
RDW: 13.9 % (ref 11.7–15.4)
WBC: 6.1 10*3/uL (ref 3.4–10.8)

## 2022-12-08 LAB — LIPID PANEL
Chol/HDL Ratio: 2.9 ratio (ref 0.0–4.4)
Cholesterol, Total: 153 mg/dL (ref 100–199)
HDL: 52 mg/dL (ref >39)
LDL Chol Calc (NIH): 86 mg/dL (ref 0–99)
Triglycerides: 80 mg/dL (ref 0–149)
VLDL Cholesterol Cal: 15 mg/dL (ref 5–40)

## 2022-12-08 LAB — CMP14+EGFR
ALT: 10 IU/L (ref 0–32)
AST: 19 IU/L (ref 0–40)
Albumin/Globulin Ratio: 1.2 (ref 1.2–2.2)
Albumin: 3.8 g/dL (ref 3.8–4.8)
Alkaline Phosphatase: 93 IU/L (ref 44–121)
BUN/Creatinine Ratio: 29 — ABNORMAL HIGH (ref 12–28)
BUN: 15 mg/dL (ref 8–27)
Bilirubin Total: 0.4 mg/dL (ref 0.0–1.2)
CO2: 20 mmol/L (ref 20–29)
Calcium: 10.4 mg/dL — ABNORMAL HIGH (ref 8.7–10.3)
Chloride: 104 mmol/L (ref 96–106)
Creatinine, Ser: 0.52 mg/dL — ABNORMAL LOW (ref 0.57–1.00)
Globulin, Total: 3.2 g/dL (ref 1.5–4.5)
Glucose: 109 mg/dL — ABNORMAL HIGH (ref 70–99)
Potassium: 4.8 mmol/L (ref 3.5–5.2)
Sodium: 140 mmol/L (ref 134–144)
Total Protein: 7 g/dL (ref 6.0–8.5)
eGFR: 94 mL/min/{1.73_m2} (ref >59)

## 2022-12-08 LAB — TSH+FREE T4
Free T4: 1.1 ng/dL (ref 0.82–1.77)
TSH: 7.52 u[IU]/mL — ABNORMAL HIGH (ref 0.450–4.500)

## 2022-12-08 LAB — HEMOGLOBIN A1C
Est. average glucose Bld gHb Est-mCnc: 111 mg/dL
Hgb A1c MFr Bld: 5.5 % (ref 4.8–5.6)

## 2022-12-10 ENCOUNTER — Other Ambulatory Visit: Payer: Self-pay | Admitting: Family Medicine

## 2022-12-10 DIAGNOSIS — E038 Other specified hypothyroidism: Secondary | ICD-10-CM

## 2023-03-28 ENCOUNTER — Telehealth: Payer: Self-pay | Admitting: Family Medicine

## 2023-03-28 ENCOUNTER — Other Ambulatory Visit: Payer: Self-pay | Admitting: Family Medicine

## 2023-03-28 DIAGNOSIS — M5432 Sciatica, left side: Secondary | ICD-10-CM

## 2023-03-28 MED ORDER — NAPROXEN 500 MG PO TABS: 500.0000 mg | ORAL_TABLET | Freq: Two times a day (BID) | ORAL | 0 refills | Status: DC | PRN

## 2023-03-28 NOTE — Telephone Encounter (Signed)
Rx sent 

## 2023-03-28 NOTE — Telephone Encounter (Signed)
Patient called said provider would send in her pain medicine for her back pain when flared up.   naproxen (NAPROSYN) 500 MG tablet   [facility]

## 2023-04-09 ENCOUNTER — Ambulatory Visit: Payer: Medicare HMO | Admitting: Orthopaedic Surgery

## 2023-04-09 ENCOUNTER — Other Ambulatory Visit: Payer: Self-pay | Admitting: Orthopaedic Surgery

## 2023-04-09 ENCOUNTER — Other Ambulatory Visit: Payer: Self-pay

## 2023-04-09 ENCOUNTER — Encounter: Payer: Self-pay | Admitting: Orthopaedic Surgery

## 2023-04-09 VITALS — BP 135/78 | HR 108 | Ht 61.5 in

## 2023-04-09 DIAGNOSIS — M8000XA Age-related osteoporosis with current pathological fracture, unspecified site, initial encounter for fracture: Secondary | ICD-10-CM

## 2023-04-09 DIAGNOSIS — S32010A Wedge compression fracture of first lumbar vertebra, initial encounter for closed fracture: Secondary | ICD-10-CM | POA: Diagnosis not present

## 2023-04-09 DIAGNOSIS — M545 Low back pain, unspecified: Secondary | ICD-10-CM

## 2023-04-09 DIAGNOSIS — S32020A Wedge compression fracture of second lumbar vertebra, initial encounter for closed fracture: Secondary | ICD-10-CM

## 2023-04-09 DIAGNOSIS — S22080A Wedge compression fracture of T11-T12 vertebra, initial encounter for closed fracture: Secondary | ICD-10-CM

## 2023-04-09 MED ORDER — HYDROCODONE-ACETAMINOPHEN 5-325 MG PO TABS: ORAL_TABLET | ORAL | 0 refills | Status: DC

## 2023-04-09 NOTE — Progress Notes (Signed)
My back hurts bad.  She has had a ten day to two week increased pain to the lower back. She has no trauma.  Nothing helps it.  She is getting worse.  She has to lay down most of the time.  Sitting can be done but it hurts.  She had a sacral insufficiency fracture last year in the spring.  She has pain of the lower back, hard to stand and walk.  NV intact. She is in pain.  She has prominence of the lower thoracic area.  X-rays were done of the lumbar spine, reported separately.  Encounter Diagnoses  Name Primary?   Lumbar pain Yes   Age-related osteoporosis with current pathological fracture, initial encounter    Compression fracture of L2 lumbar vertebra, closed, initial encounter (HCC)    Compression fracture of T12 vertebra, initial encounter (HCC)    Compression fracture of L1 lumbar vertebra, closed, initial encounter (HCC)    I have explained the findings to her.  She has pathological osteoporosis compression fractures of T12, L1 and L2.  I will give pain medicine.  I will see her in one week.  I have reviewed the [place] Controlled Substance Reporting System web site prior to prescribing narcotic medicine for this patient.  I will have her see Dr. Fransico Him for further treatment of the osteoporosis.  Call if any problem.  Precautions discussed.  Electronically Signed Darreld Mclean, MD 7/23/202410:34 AM

## 2023-04-09 NOTE — Patient Instructions (Addendum)
Dr Fransico Him will see you for the osteoporosis his office will call you.   Spinal Compression Fracture  A spinal compression fracture is a collapse of the bones that form the spine (vertebrae). With this type of fracture, the vertebrae become pushed (compressed) into a wedge shape. Most compression fractures happen in the middle or lower part of the spine. What are the causes? This condition may be caused by: Thinning and loss of density in the bones (osteoporosis). This is the most common cause. A fall. A car or motorcycle accident. Cancer. Trauma, such as a heavy, direct hit to the head or back. What increases the risk? You are more likely to develop this condition if: You are 81 years of age or older. You have osteoporosis. You have certain types of cancer, including: Multiple myeloma. Lymphoma. Prostate cancer. Lung cancer. Breast cancer. What are the signs or symptoms? Symptoms of this condition include: Severe pain with simple movements such as coughing or sneezing. Pain that gets worse over time. Pain that is worse when you stand, walk, sit, or bend. Sudden pain that is so bad that it is hard for you to move. Bending or humping of the spine. Gradual loss of height. Numbness, tingling, or weakness in the back and legs. Trouble walking. Your symptoms will depend on the cause of the fracture and how quickly it develops. How is this diagnosed? This condition may be diagnosed based on symptoms, medical history, and a physical exam. During the physical exam, your health care provider may tap along the length of your spine to check for tenderness. Tests may be done to confirm the diagnosis. They may include: A bone mineral density test to check for osteoporosis. Imaging tests, such as a spine X-ray, CT scan, or MRI. How is this treated? Treatment depends on the cause and severity of the condition. Some fractures may heal on their own with supportive care. Treatment may include: Pain  medicine. Rest. A back brace. Physical therapy exercises. Medicine to strengthen bone. Calcium and vitamin D supplements. Fractures that cause the back to become misshapen, cause nerve pain or weakness, or do not respond to other treatment may be treated with surgery. This may include: Vertebroplasty. Bone cement is injected into the collapsed vertebrae to stabilize them. Balloon kyphoplasty. The collapsed vertebrae are expanded with a balloon and then bone cement is injected into them. Spinal fusion. The collapsed vertebrae are connected (fused) to normal vertebrae. Follow these instructions at home: Medicines Take over-the-counter and prescription medicines only as told by your health care provider. Ask your health care provider if the medicine prescribed to you: Requires you to avoid driving or using machinery. Can cause constipation. You may need to take these actions to prevent or treat constipation: Drink enough fluid to keep your urine pale yellow. Take over-the-counter or prescription medicines. Eat foods that are high in fiber, such as beans, whole grains, and fresh fruits and vegetables. Limit foods that are high in fat and processed sugars, such as fried or sweet foods. If you have a brace: Wear the brace as told by your health care provider. Remove it only as told by your health care provider. Loosen the brace if your fingers or toes tingle, become numb, or turn cold and blue. Keep the brace clean. If the brace is not waterproof: Do not let it get wet. Cover it with a watertight covering when you take a bath or a shower. Managing pain, stiffness, and swelling  If directed, put ice on  the injured area. To do this: If you have a removable brace, remove it as told by your health care provider. Put ice in a plastic bag. Place a towel between your skin and the bag. Leave the ice on for 20 minutes, 2-3 times a day. Remove the ice if your skin turns bright red. This is very  important. If you cannot feel pain, heat, or cold, you have a greater risk of damage to the area. Activity Rest as told by your health care provider. Avoid sitting for a long time without moving. Get up to take short walks every 1-2 hours. This is important to improve blood flow and breathing. Ask for help if you feel weak or unsteady. Return to your normal activities as told by your health care provider. Ask what activities are safe for you. Do physical therapy exercises to improve movement and strength in your back, as recommended by your health care provider. Exercise regularly as directed by your health care provider. General instructions  Do not drink alcohol. Alcohol can interfere with your treatment. Do not use any products that contain nicotine or tobacco, such as cigarettes, e-cigarettes, and chewing tobacco. These can delay bone healing. If you need help quitting, ask your health care provider. Keep all follow-up visits. This is important. It can help to prevent permanent injury, disability, and long-lasting (chronic) pain. Contact a health care provider if: You have a fever. Your pain medicine is not helping. Your pain does not get better over time. You cannot return to your normal activities as planned or expected. Get help right away if: Your pain is very bad and it suddenly gets worse. You are unable to move any body part (paralysis) that is below the level of your injury. You have numbness, tingling, or weakness in any body part that is below the level of your injury. You cannot control your bladder or bowels. Summary A spinal compression fracture is a collapse of the bones that form the spine (vertebrae). With this type of fracture, the vertebrae become pushed (compressed) into a wedge shape. Your symptoms and treatment will depend on the cause and severity of the fracture and how quickly it develops. Some fractures may heal on their own with supportive care. Fractures that  cause the back to become misshapen, cause nerve pain or weakness, or do not respond to other treatment may be treated with surgery. This information is not intended to replace advice given to you by your health care provider. Make sure you discuss any questions you have with your health care provider. Document Revised: 12/23/2019 Document Reviewed: 12/23/2019 Elsevier Patient Education  2024 [employer] Stop the Naprosyn See your primary care or dermatologist for the rash you have

## 2023-04-16 ENCOUNTER — Encounter: Payer: Self-pay | Admitting: Orthopaedic Surgery

## 2023-04-16 ENCOUNTER — Ambulatory Visit: Payer: Medicare HMO | Admitting: Orthopaedic Surgery

## 2023-04-16 VITALS — BP 135/78 | Ht 61.5 in | Wt 178.0 lb

## 2023-04-16 DIAGNOSIS — M8000XD Age-related osteoporosis with current pathological fracture, unspecified site, subsequent encounter for fracture with routine healing: Secondary | ICD-10-CM

## 2023-04-16 DIAGNOSIS — M545 Low back pain, unspecified: Secondary | ICD-10-CM

## 2023-04-16 MED ORDER — OXYCODONE-ACETAMINOPHEN 5-325 MG PO TABS: 1.0000 | ORAL_TABLET | Freq: Four times a day (QID) | ORAL | 0 refills | Status: DC | PRN

## 2023-04-16 NOTE — Progress Notes (Signed)
My back hurts.  The Norco 5 did help her pain some but she has pain still and she is not comfortable.  I will increase the medicine to Percocet.  I have cautioned about constipation.  She has left sided sciatica also.  Encounter Diagnoses  Name Primary?   Age-related osteoporosis with current pathological fracture with routine healing, subsequent encounter Yes   Lumbar pain    I have reviewed the [place] Controlled Substance Reporting System web site prior to prescribing narcotic medicine for this patient.  Return in two weeks.  Call if any problem.  Precautions discussed.  Electronically Signed Darreld Mclean, MD 7/30/20242:14 PM

## 2023-04-17 ENCOUNTER — Ambulatory Visit: Payer: Medicare HMO | Admitting: "Endocrinology

## 2023-04-17 ENCOUNTER — Encounter: Payer: Self-pay | Admitting: "Endocrinology

## 2023-04-17 VITALS — BP 112/74 | HR 96

## 2023-04-17 DIAGNOSIS — M810 Age-related osteoporosis without current pathological fracture: Secondary | ICD-10-CM | POA: Diagnosis not present

## 2023-04-17 NOTE — Progress Notes (Signed)
Endocrinology Consult Note                                            04/17/2023, 5:02 PM   Subjective:    Patient ID: Natasha Willis, female    DOB: 08-26-1941, PCP Gilmore Laroche, FNP   Past Medical History:  Diagnosis Date   Asthma    Diverticulitis    Hypertension    Thyroid disease    Past Surgical History:  Procedure Laterality Date   ABDOMINAL HYSTERECTOMY     ANTERIOR AND POSTERIOR REPAIR     APPENDECTOMY     CATARACT EXTRACTION W/PHACO Right 08/19/2014   Procedure: CATARACT EXTRACTION PHACO AND INTRAOCULAR LENS PLACEMENT RIGHT EYE;  Surgeon: Gemma Payor, MD;  Location: [facility];  Service: Ophthalmology;  Laterality: Right;  CDE:7.60   CATARACT EXTRACTION W/PHACO Left 08/30/2014   Procedure: CATARACT EXTRACTION PHACO AND INTRAOCULAR LENS PLACEMENT (IOC);  Surgeon: Gemma Payor, MD;  Location: [facility];  Service: Ophthalmology;  Laterality: Left;  CDE:5.49   CHOLECYSTECTOMY     TUBAL LIGATION     Social History   Socioeconomic History   Marital status: Legally Separated    Spouse name: Not on file   Number of children: 5   Years of education: Not on file   Highest education level: 10th grade  Occupational History   Not on file  Tobacco Use   Smoking status: Never   Smokeless tobacco: Never  Substance and Sexual Activity   Alcohol use: No   Drug use: No   Sexual activity: Not Currently    Birth control/protection: Surgical  Other Topics Concern   Not on file  Social History Narrative   Lives alone   5 children, one lives 2 doors down.   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   No weapons   Social Determinants of Health   Financial Resource Strain: Low Risk  (12/05/2022)   Overall Financial Resource Strain (CARDIA)     Difficulty of Paying Living Expenses: Not hard at all  Food Insecurity: No Food Insecurity (12/05/2022)   Hunger Vital Sign    Worried About Running Out of Food in the Last Year: Never true    Ran Out of Food in the Last Year: Never true  Transportation Needs: No Transportation Needs (12/05/2022)   PRAPARE - Administrator, Civil Service (Medical): No    Lack of Transportation (Non-Medical): No  Physical Activity: Sufficiently Active (12/05/2022)   Exercise Vital Sign    Days of Exercise per Week: 7 days    Minutes of Exercise per Session: 30 min  Stress: No Stress Concern Present (12/05/2022)   [employer] of Occupational Health - Occupational Stress Questionnaire    Feeling of Stress : Not at all  Social Connections: Unknown (12/05/2022)   Social Connection and Isolation Panel [NHANES]    Frequency of Communication with Friends and Family: More than three times a week    Frequency of Social Gatherings with Friends and Family:  Twice a week    Attends Religious Services: More than 4 times per year    Active Member of Clubs or Organizations: No    Attends Engineer, structural: Not on file    Marital Status: Patient declined   Family History  Problem Relation Age of Onset   Diabetes Brother    Heart disease Brother    Diabetes Brother    Heart disease Brother    Outpatient Encounter Medications as of 04/17/2023  Medication Sig   carboxymethylcellulose 1 % ophthalmic solution 1 drop 3 (three) times daily.   Cholecalciferol (VITAMIN D) 50 MCG (2000 UT) CAPS Take by mouth. Take one capsule daily   Fish Oil-Cholecalciferol (FISH OIL + D3) 1000-1000 MG-UNIT CAPS Take 1 capsule by mouth daily.    Multiple Vitamins-Minerals (CENTRAL-VITE FOR SENIORS PO) Take 1 tablet by mouth daily.    naproxen (NAPROSYN) 500 MG tablet Take 1 tablet (500 mg total) by mouth 2 (two) times daily as needed. (Patient not taking: Reported on 04/17/2023)   oxyCODONE-acetaminophen  (PERCOCET/ROXICET) 5-325 MG tablet Take 1 tablet by mouth every 6 (six) hours as needed for up to 14 days for severe pain. One tablet every six hours for pain. 14 day limit   [DISCONTINUED] HYDROcodone-acetaminophen (NORCO/VICODIN) 5-325 MG tablet One tablet every four hours for pain.   No facility-administered encounter medications on file as of 04/17/2023.   ALLERGIES: Allergies  Allergen Reactions   Penicillins     VACCINATION STATUS:  There is no immunization history on file for this patient.  HPI Natasha Willis is 82 y.o. female who presents today with a medical history as above. she is being seen in consultation for osteoporosis requested by Gilmore Laroche, FNP.  She is accompanied by her daughter to clinic.  History is obtained from the family as well as chart review. Her medical history significant for chronic lumbar pain, compression fracture of L2 lumbar vertebra, T12 as well as L1. She is a survivor of childhood polio.  She is wheelchair-bound due to disequilibrium as well as kyphoscoliosis. She was offered bone density study in November 2022 which showed -3.4 T-score on right femur -2.7 on right forearm. Lumbar spine was not included due to degenerative joint disease.   She is on opioids for pain control as well as vitamin D treatment for vitamin D deficiency. She denies any clear to high-dose steroids.  She denies family history of osteoporosis. She wishes to be treated for osteoporosis. Her medical history also includes prediabetes, subclinical hypothyroidism, not on treatment.  She denies any history of parathyroid dysfunction.  No history of CKD nor liver disease. Review of Systems  Constitutional: + Mildly fluctuating body weight,  no fatigue, no subjective hyperthermia, no subjective hypothermia Eyes: no blurry vision, no xerophthalmia ENT: no sore throat, no nodules palpated in throat, no dysphagia/odynophagia, no hoarseness Cardiovascular: no Chest Pain, no  Shortness of Breath, no palpitations, no leg swelling Respiratory: no cough, no shortness of breath Gastrointestinal: no Nausea/Vomiting/Diarhhea Musculoskeletal: + Wheelchair-bound due to disequilibrium, no muscle/ + vertebral joint aches Skin: no rashes Neurological: no tremors, no numbness, no tingling, no dizziness Psychiatric: no depression, no anxiety  Objective:       04/17/2023    1:19 PM 04/16/2023    1:52 PM 04/09/2023    9:33 AM  Vitals with BMI  Height  5' 1.5" 5' 1.5"  Weight  178 lbs   BMI  33.09   Systolic 112 135 952  Diastolic 74 78  78  Pulse 96  108    BP 112/74   Pulse 96   Wt Readings from Last 3 Encounters:  04/16/23 178 lb (80.7 kg)  12/07/22 178 lb 1.3 oz (80.8 kg)  08/07/22 186 lb 1.9 oz (84.4 kg)    Physical Exam  Constitutional: + Wheelchair-bound, not in acute distress, normal state of mind Eyes: PERRLA, EOMI, no exophthalmos ENT: moist mucous membranes, no gross thyromegaly, no gross cervical lymphadenopathy Cardiovascular: normal precordial activity, Regular Rate and Rhythm, no Murmur/Rubs/Gallops Respiratory:  adequate breathing efforts, no gross chest deformity, Clear to auscultation bilaterally Gastrointestinal: abdomen soft, Non -tender, No distension, Bowel Sounds present, no gross organomegaly Musculoskeletal: + Significant kyphoscoliosis of thoracolumbar spine,  strength intact in all four extremities, no peripheral edema Skin: moist, warm, no rashes Neurological: no tremor with outstretched hands, Deep tendon reflexes normal in bilateral lower extremities.  CMP ( most recent) CMP     Component Value Date/Time   NA 140 12/07/2022 1051   K 4.8 12/07/2022 1051   CL 104 12/07/2022 1051   CO2 20 12/07/2022 1051   GLUCOSE 109 (H) 12/07/2022 1051   GLUCOSE 102 (H) 06/30/2020 0922   BUN 15 12/07/2022 1051   CREATININE 0.52 (L) 12/07/2022 1051   CREATININE 0.48 (L) 06/30/2020 0922   CALCIUM 10.4 (H) 12/07/2022 1051   PROT 7.0  12/07/2022 1051   ALBUMIN 3.8 12/07/2022 1051   AST 19 12/07/2022 1051   ALT 10 12/07/2022 1051   ALKPHOS 93 12/07/2022 1051   BILITOT 0.4 12/07/2022 1051   EGFR 94 12/07/2022 1051   GFRNONAA 84 10/26/2020 0906   GFRNONAA 94 06/30/2020 0922     Diabetic Labs (most recent): Lab Results  Component Value Date   HGBA1C 5.5 12/07/2022   HGBA1C 5.9 (H) 08/07/2022   HGBA1C 5.8 (H) 02/01/2022     Lipid Panel ( most recent) Lipid Panel     Component Value Date/Time   CHOL 153 12/07/2022 1051   TRIG 80 12/07/2022 1051   HDL 52 12/07/2022 1051   CHOLHDL 2.9 12/07/2022 1051   CHOLHDL 3.5 12/28/2019 0838   LDLCALC 86 12/07/2022 1051   LDLCALC 107 (H) 12/28/2019 0838   LABVLDL 15 12/07/2022 1051      Lab Results  Component Value Date   TSH 7.520 (H) 12/07/2022   TSH 4.980 (H) 08/07/2022   TSH 4.330 08/03/2021   TSH 5.270 (H) 02/28/2021   TSH 3.480 10/26/2020   FREET4 1.10 12/07/2022   FREET4 1.02 08/07/2022   FREET4 1.03 02/28/2021   FREET4 1.51 10/26/2020    DualFemur Total Right 07/18/2021 79.3 Osteoporosis -3.4 0.575 g/cm2  DualFemur Total Mean 07/18/2021 79.3 Osteoporosis -3.2 0.607 g/cm2 -  Right Forearm Radius 33% 07/18/2021 79.3 Osteoporosis -2.7 0.523 g/cm2 - - ASSESSMENT: The BMD measured at Femur Total Right is 0.575 g/cm2 with a T-score of -3.4. This patient is considered osteoporotic according to World Health Organization [facility]) criteria. The scan quality is good. Lumbar spine was excluded due to advanced degenerative changes and multiple compression fractures.  Assessment & Plan:   1. Age-related osteoporosis with current pathological fracture   - Natasha Willis  is being seen at a kind request of Gilmore Laroche, FNP. - I have reviewed her available  records and clinically evaluated the patient. - Based on these reviews, she has severe osteoporosis of hips and forearm.  Her osteoporosis is complicated by multiple compression fractures of the  spine.  I reviewed her most recent bone density  from November 2022 with the family.  Her T-score is low at -3.4  right dualfemur, mean T-score of -3.2 dual femur, -2.7 on right forearm. I discussed the fact her risk for future fractures is high and needs to be treated.  She would benefit from Prolia . Will initiate the process for her to obtain coverage for Prolia 60 mg subcutaneously every 6 months. The family is made aware of the fact that his treatment is to continue without interruption.  If her insurance does not provide coverage, she will be considered for IV Reclast or subcutaneous daily Forteo. -Her calcium/vitamin D status is acceptable.   -Fall precautions discussed with her.  Her next labs will include - Comprehensive metabolic panel - TSH - T4, free - PTH, intact and calcium - Magnesium - Phosphorus - DG Bone Density   - she is advised to maintain close follow up with Gilmore Laroche, FNP for primary care needs.   -Thank you for involving me in the care of this pleasant patient.  Time spent with the patient: 45  minutes, of which >50% was spent in  counseling her about her osteoporosis and the rest in obtaining information about her symptoms, reviewing her previous labs/studies ( including abstractions from other facilities),  evaluations, and treatments,  and developing a plan to confirm diagnosis and long term treatment based on the latest standards of care/guidelines; and documenting her care.  Natasha Willis participated in the discussions, expressed understanding, and voiced agreement with the above plans.  All questions were answered to her satisfaction. she is encouraged to contact clinic should she have any questions or concerns prior to her return visit.  Follow up plan: Return in about 6 months (around 10/18/2023), or Prolia as soon as ready and during next visit, for Prolia During NV, DXA Scan B4 NV, F/U with Pre-visit Labs.   Marquis Lunch, MD [facility]  [facility] [address] [place], [place] [address] Phone: [phone]  Fax: [phone]     04/17/2023, 5:02 PM  This note was partially dictated with voice recognition software. Similar sounding words can be transcribed inadequately or may not  be corrected upon review.

## 2023-04-18 ENCOUNTER — Other Ambulatory Visit (HOSPITAL_COMMUNITY): Payer: Self-pay

## 2023-04-18 ENCOUNTER — Other Ambulatory Visit: Payer: Self-pay

## 2023-04-18 DIAGNOSIS — M810 Age-related osteoporosis without current pathological fracture: Secondary | ICD-10-CM

## 2023-04-18 MED ORDER — PROLIA 60 MG/ML ~~LOC~~ SOSY
60.0000 mg | PREFILLED_SYRINGE | SUBCUTANEOUS | 1 refills | Status: DC
  Filled 2023-04-18 (×2): qty 1, 180d supply, fill #0
  Filled 2023-10-02: qty 1, 180d supply, fill #1

## 2023-04-19 ENCOUNTER — Ambulatory Visit: Payer: Medicare HMO | Admitting: Family Medicine

## 2023-04-22 ENCOUNTER — Encounter: Payer: Self-pay | Admitting: Family Medicine

## 2023-04-22 ENCOUNTER — Ambulatory Visit (INDEPENDENT_AMBULATORY_CARE_PROVIDER_SITE_OTHER): Payer: Medicare HMO | Admitting: Family Medicine

## 2023-04-22 ENCOUNTER — Ambulatory Visit (INDEPENDENT_AMBULATORY_CARE_PROVIDER_SITE_OTHER): Payer: Medicare HMO

## 2023-04-22 ENCOUNTER — Ambulatory Visit: Payer: Medicare HMO | Admitting: Family Medicine

## 2023-04-22 VITALS — BP 104/68 | HR 96

## 2023-04-22 VITALS — BP 99/65 | HR 121 | Ht 61.5 in | Wt 160.0 lb

## 2023-04-22 DIAGNOSIS — E038 Other specified hypothyroidism: Secondary | ICD-10-CM | POA: Diagnosis not present

## 2023-04-22 DIAGNOSIS — I10 Essential (primary) hypertension: Secondary | ICD-10-CM | POA: Diagnosis not present

## 2023-04-22 DIAGNOSIS — M810 Age-related osteoporosis without current pathological fracture: Secondary | ICD-10-CM | POA: Diagnosis not present

## 2023-04-22 DIAGNOSIS — M5430 Sciatica, unspecified side: Secondary | ICD-10-CM

## 2023-04-22 DIAGNOSIS — E7849 Other hyperlipidemia: Secondary | ICD-10-CM | POA: Diagnosis not present

## 2023-04-22 DIAGNOSIS — E559 Vitamin D deficiency, unspecified: Secondary | ICD-10-CM

## 2023-04-22 DIAGNOSIS — R21 Rash and other nonspecific skin eruption: Secondary | ICD-10-CM

## 2023-04-22 DIAGNOSIS — R7301 Impaired fasting glucose: Secondary | ICD-10-CM | POA: Diagnosis not present

## 2023-04-22 MED ORDER — DENOSUMAB 60 MG/ML ~~LOC~~ SOSY
60.0000 mg | PREFILLED_SYRINGE | Freq: Once | SUBCUTANEOUS | Status: AC
Start: 2023-04-22 — End: 2023-04-22
  Administered 2023-04-22: 60 mg via SUBCUTANEOUS

## 2023-04-22 NOTE — Assessment & Plan Note (Signed)
Controlled Not on medications Encouraged lifestyle modification with low-sodium diet and increase physical activity BP Readings from Last 3 Encounters:  04/22/23 104/68  04/22/23 99/65  04/17/23 112/74

## 2023-04-22 NOTE — Assessment & Plan Note (Signed)
Currently not on medications Pending thyroid levels Lab Results  Component Value Date   TSH 7.520 (H) 12/07/2022

## 2023-04-22 NOTE — Progress Notes (Signed)
Established Patient Office Visit  Subjective:  Patient ID: Natasha Willis, female    DOB: November 09, 1940  Age: 82 y.o. MRN: [id number]  CC:  Chief Complaint  Patient presents with   Care Management    4 month f/u, would like to have vitamin d levels checked.    Rash    Pt has a rash that has spread in her arms started in 02/16/2023    HPI Natasha Willis is a 82 y.o. female with past medical history of sciatica, essential hypertension, hypothyroidism presents for f/u of  chronic medical conditions. For the details of today's visit, please refer to the assessment and plan.     Past Medical History:  Diagnosis Date   Asthma    Diverticulitis    Hypertension    Thyroid disease     Past Surgical History:  Procedure Laterality Date   ABDOMINAL HYSTERECTOMY     ANTERIOR AND POSTERIOR REPAIR     APPENDECTOMY     CATARACT EXTRACTION W/PHACO Right 08/19/2014   Procedure: CATARACT EXTRACTION PHACO AND INTRAOCULAR LENS PLACEMENT RIGHT EYE;  Surgeon: Gemma Payor, MD;  Location: [facility];  Service: Ophthalmology;  Laterality: Right;  CDE:7.60   CATARACT EXTRACTION W/PHACO Left 08/30/2014   Procedure: CATARACT EXTRACTION PHACO AND INTRAOCULAR LENS PLACEMENT (IOC);  Surgeon: Gemma Payor, MD;  Location: [facility];  Service: Ophthalmology;  Laterality: Left;  CDE:5.49   CHOLECYSTECTOMY     TUBAL LIGATION      Family History  Problem Relation Age of Onset   Diabetes Brother    Heart disease Brother    Diabetes Brother    Heart disease Brother     Social History   Socioeconomic History   Marital status: Legally Separated    Spouse name: Not on file   Number of children: 5   Years of education: Not on file   Highest education level: 10th grade  Occupational History   Not on file  Tobacco Use   Smoking status: Never   Smokeless tobacco: Never  Substance and Sexual Activity   Alcohol use: No   Drug use: No   Sexual activity: Not Currently    Birth control/protection: Surgical   Other Topics Concern   Not on file  Social History Narrative   Lives alone   5 children, one lives 2 doors down.   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   No weapons   Social Determinants of Health   Financial Resource Strain: Low Risk  (12/05/2022)   Overall Financial Resource Strain (CARDIA)    Difficulty of Paying Living Expenses: Not hard at all  Food Insecurity: No Food Insecurity (12/05/2022)   Hunger Vital Sign    Worried About Running Out of Food in the Last Year: Never true    Ran Out of Food in the Last Year: Never true  Transportation Needs: No Transportation Needs (12/05/2022)   PRAPARE - Administrator, Civil Service (Medical): No    Lack of Transportation (Non-Medical): No  Physical Activity: Sufficiently Active (12/05/2022)   Exercise Vital Sign    Days of Exercise per Week: 7 days  Minutes of Exercise per Session: 30 min  Stress: No Stress Concern Present (12/05/2022)   [employer] of Occupational Health - Occupational Stress Questionnaire    Feeling of Stress : Not at all  Social Connections: Unknown (12/05/2022)   Social Connection and Isolation Panel [NHANES]    Frequency of Communication with Friends and Family: More than three times a week    Frequency of Social Gatherings with Friends and Family: Twice a week    Attends Religious Services: More than 4 times per year    Active Member of [employer] or Organizations: No    Attends Engineer, structural: Not on file    Marital Status: Patient declined  Intimate Partner Violence: Not At Risk (03/24/2020)   Humiliation, Afraid, Rape, and Kick questionnaire    Fear of Current or Ex-Partner: No    Emotionally Abused: No    Physically Abused: No    Sexually Abused: No     Outpatient Medications Prior to Visit  Medication Sig Dispense Refill   carboxymethylcellulose 1 % ophthalmic solution 1 drop 3 (three) times daily.     Cholecalciferol (VITAMIN D) 50 MCG (2000 UT) CAPS Take by mouth. Take one capsule daily     denosumab (PROLIA) 60 MG/ML SOSY injection Inject 60 mg into the skin every 6 (six) months. 1 mL 1   Fish Oil-Cholecalciferol (FISH OIL + D3) 1000-1000 MG-UNIT CAPS Take 1 capsule by mouth daily.      Multiple Vitamins-Minerals (CENTRAL-VITE FOR SENIORS PO) Take 1 tablet by mouth daily.      oxyCODONE-acetaminophen (PERCOCET/ROXICET) 5-325 MG tablet Take 1 tablet by mouth every 6 (six) hours as needed for up to 14 days for severe pain. One tablet every six hours for pain. 14 day limit 56 tablet 0   naproxen (NAPROSYN) 500 MG tablet Take 1 tablet (500 mg total) by mouth 2 (two) times daily as needed. 20 tablet 0   No facility-administered medications prior to visit.    Allergies  Allergen Reactions   Penicillins     ROS Review of Systems  Constitutional:  Negative for chills and fever.  Eyes:  Negative for visual disturbance.  Respiratory:  Negative for chest tightness and shortness of breath.   Skin:  Positive for rash.  Neurological:  Negative for dizziness and headaches.      Objective:    Physical Exam HENT:     Head: Normocephalic.     Mouth/Throat:     Mouth: Mucous membranes are moist.  Cardiovascular:     Rate and Rhythm: Normal rate.     Heart sounds: Normal heart sounds.  Pulmonary:     Effort: Pulmonary effort is normal.     Breath sounds: Normal breath sounds.  Skin:    Findings: Rash (arms bilaterally) present.  Neurological:     Mental Status: She is alert.     BP 99/65   Pulse (!) 121   Ht 5' 1.5" (1.562 m)   Wt 160 lb 0.6 oz (72.6 kg)   SpO2 92%   BMI 29.75 kg/m  Wt Readings from Last 3 Encounters:  04/22/23 160 lb 0.6 oz (72.6 kg)  04/16/23 178 lb (80.7 kg)  12/07/22 178 lb 1.3 oz (80.8 kg)     Lab Results  Component Value Date   TSH 7.520 (H) 12/07/2022   Lab Results  Component Value Date   WBC 6.1 12/07/2022   HGB 11.9 12/07/2022   HCT 37.3 12/07/2022   MCV 89 12/07/2022  PLT 323 12/07/2022   Lab Results  Component Value Date   NA 140 12/07/2022   K 4.8 12/07/2022   CO2 20 12/07/2022   GLUCOSE 109 (H) 12/07/2022   BUN 15 12/07/2022   CREATININE 0.52 (L) 12/07/2022   BILITOT 0.4 12/07/2022   ALKPHOS 93 12/07/2022   AST 19 12/07/2022   ALT 10 12/07/2022   PROT 7.0 12/07/2022   ALBUMIN 3.8 12/07/2022   CALCIUM 10.4 (H) 12/07/2022   ANIONGAP 11 08/17/2014   EGFR 94 12/07/2022   Lab Results  Component Value Date   CHOL 153 12/07/2022   Lab Results  Component Value Date   HDL 52 12/07/2022   Lab Results  Component Value Date   LDLCALC 86 12/07/2022   Lab Results  Component Value Date   TRIG 80 12/07/2022   Lab Results  Component Value Date   CHOLHDL 2.9 12/07/2022   Lab Results  Component Value Date   HGBA1C 5.5 12/07/2022      Assessment & Plan:  IFG (impaired fasting glucose) -     Hemoglobin A1c  Vitamin D deficiency -     VITAMIN D 25 Hydroxy (Vit-D Deficiency, Fractures)  Other specified hypothyroidism Assessment & Plan: Currently not on medications Pending thyroid levels Lab Results  Component Value Date   TSH 7.520 (H) 12/07/2022     Orders: -     TSH + free T4  Other hyperlipidemia -     Lipid panel -     CMP14+EGFR -     CBC with Differential/Platelet  Rash in adult -     Ambulatory referral to Dermatology  Sciatica, unspecified laterality  Essential hypertension Assessment & Plan: Controlled Not on medications Encouraged lifestyle modification with low-sodium diet and increase physical activity BP Readings from Last 3 Encounters:  04/22/23 104/68  04/22/23 99/65  04/17/23 112/74      Rash Assessment & Plan: Erythematous pruritic rash that has spread since it's onset on the arms  bilaterally Reports using calamine lotion with relief Encouraged to continue treatment Denies systemic symptoms with the rash Urgent referral to dermatology     Note: This chart has been completed using Engineer, civil (consulting) software, and while attempts have been made to ensure accuracy, certain words and phrases may not be transcribed as intended.    Follow-up: Return in about 3 months (around 07/23/2023).   Gilmore Laroche, FNP

## 2023-04-22 NOTE — Patient Instructions (Addendum)
  I appreciate the opportunity to provide care to you today!    Follow up:  3 months  Labs: please stop by the lab today to get your blood drawn (CBC, CMP, TSH, Lipid profile, HgA1c, Vit D)  Please schedule Medicare Annual Wellness Visit  Apply heat to the affected area such as a moist heat pack or a heating pad. Place a towel between your skin and the heat source. Leave the heat on for 20-30 minutes. Remove the heat if your skin turns bright red. This is especially important if you are unable to feel pain, heat, or cold. You may have a greater risk of getting burned. Apply  ice on the painful area. To do this: If you have a removable splint, remove it as told by your health care provider. Put ice in a plastic bag. Place a towel between your skin and the bag or between your splint and the bag. Leave the ice on for 20 minutes, 2-3 times a day.     Referrals today-  urgent referral to dermatology   Attached with your AVS, you will find valuable resources for self-education. I highly recommend dedicating some time to thoroughly examine them.   Please continue to a heart-healthy diet and increase your physical activities. Try to exercise for at least five days a week.    It was a pleasure to see you and I look forward to continuing to work together on your health and well-being. Please do not hesitate to call the office if you need care or have questions about your care.  In case of emergency, please visit the Emergency Department for urgent care, or contact our clinic at [phone] to schedule an appointment. We're here to help you!   Have a wonderful day and week. With Gratitude, Gilmore Laroche MSN, FNP-BC

## 2023-04-22 NOTE — Patient Instructions (Signed)
[id number] ° °

## 2023-04-22 NOTE — Progress Notes (Signed)
Pt seen for Nurse's visit for prolia injection. Pt provided prolia. Prolia 60mg  administered SQ in upper L arm without difficulty. Lot #[id number], Exp.Date 03/16/2025. Pt waited the required 15 minutes post injection, no reaction noted.

## 2023-04-22 NOTE — Assessment & Plan Note (Addendum)
Erythematous pruritic rash that has spread since it's onset on the arms bilaterally Reports using calamine lotion with relief Encouraged to continue treatment Denies systemic symptoms with the rash Urgent referral to dermatology

## 2023-04-24 ENCOUNTER — Other Ambulatory Visit: Payer: Self-pay | Admitting: Family Medicine

## 2023-04-24 DIAGNOSIS — D508 Other iron deficiency anemias: Secondary | ICD-10-CM

## 2023-04-24 MED ORDER — IRON (FERROUS SULFATE) 325 (65 FE) MG PO TABS
325.0000 mg | ORAL_TABLET | Freq: Every day | ORAL | 2 refills | Status: AC
Start: 2023-04-24 — End: ?

## 2023-04-30 ENCOUNTER — Ambulatory Visit: Payer: Medicare HMO | Admitting: Orthopaedic Surgery

## 2023-04-30 ENCOUNTER — Encounter: Payer: Self-pay | Admitting: Orthopaedic Surgery

## 2023-04-30 VITALS — BP 109/72 | HR 118

## 2023-04-30 DIAGNOSIS — M545 Low back pain, unspecified: Secondary | ICD-10-CM

## 2023-04-30 MED ORDER — OXYCODONE-ACETAMINOPHEN 5-325 MG PO TABS: 1.0000 | ORAL_TABLET | Freq: Four times a day (QID) | ORAL | 0 refills | Status: AC | PRN

## 2023-04-30 NOTE — Progress Notes (Signed)
I am a little better.  Her lower back pain is less.  I had given Percocet and that has really helped.  She is doing better.  Lumbar pain is present.  No spasm.  Muscle tone and strength is good.  NV intact.     Encounter Diagnosis  Name Primary?   Lumbar pain Yes   I will see her in one month.  Call if any problem.  Precautions discussed.  I have reviewed the [place] Controlled Substance Reporting System web site prior to prescribing narcotic medicine for this patient.  Electronically Signed Darreld Mclean, MD 8/13/202410:26 AM

## 2023-05-08 DIAGNOSIS — L258 Unspecified contact dermatitis due to other agents: Secondary | ICD-10-CM | POA: Diagnosis not present

## 2023-05-28 ENCOUNTER — Ambulatory Visit: Payer: Medicare HMO | Admitting: Orthopaedic Surgery

## 2023-05-28 ENCOUNTER — Encounter: Payer: Self-pay | Admitting: Orthopaedic Surgery

## 2023-05-28 VITALS — BP 110/52 | HR 103 | Ht 61.0 in | Wt 160.0 lb

## 2023-05-28 DIAGNOSIS — M545 Low back pain, unspecified: Secondary | ICD-10-CM

## 2023-05-28 NOTE — Progress Notes (Signed)
My back is better.  She has less back pain. She still has problems with balance and walking but the back is much better.  Lumbar spine with no spasm. She is in wheelchair.  Muscle tone and strength is good.  Encounter Diagnosis  Name Primary?   Lumbar pain Yes   I will see in six weeks.  Call if any problem.  Precautions discussed.  Electronically Signed Darreld Mclean, MD 9/10/20249:57 AM

## 2023-05-29 DIAGNOSIS — L258 Unspecified contact dermatitis due to other agents: Secondary | ICD-10-CM | POA: Diagnosis not present

## 2023-06-08 ENCOUNTER — Encounter (HOSPITAL_COMMUNITY): Payer: Self-pay

## 2023-07-09 ENCOUNTER — Ambulatory Visit: Payer: Medicare HMO | Admitting: Orthopaedic Surgery

## 2023-07-09 ENCOUNTER — Encounter: Payer: Self-pay | Admitting: Orthopaedic Surgery

## 2023-07-09 DIAGNOSIS — M545 Low back pain, unspecified: Secondary | ICD-10-CM

## 2023-07-09 NOTE — Progress Notes (Signed)
My back is tender  She has good and bad days with her lower back pain.  She is in a wheelchair most of the day but is getting up more without it.  She has no new trauma, no falls, no weakness.  She is taking her medicine.  Lower back is tender but no spasm.  Motion is good.  Muscle tone and strength is normal.  Encounter Diagnosis  Name Primary?   Lumbar pain Yes   I will see her in three months.  Continue activity at home.  Call if any problem.  Precautions discussed.  Electronically Signed Darreld Mclean, MD 10/22/20249:39 AM

## 2023-07-22 ENCOUNTER — Ambulatory Visit: Payer: Medicare Other | Admitting: "Endocrinology

## 2023-07-24 ENCOUNTER — Ambulatory Visit: Payer: Medicare HMO | Admitting: Family Medicine

## 2023-07-30 ENCOUNTER — Encounter: Payer: Self-pay | Admitting: Family Medicine

## 2023-07-30 ENCOUNTER — Ambulatory Visit (INDEPENDENT_AMBULATORY_CARE_PROVIDER_SITE_OTHER): Payer: Medicare HMO | Admitting: Family Medicine

## 2023-07-30 VITALS — BP 126/63 | HR 91 | Resp 16 | Ht 61.0 in | Wt 153.4 lb

## 2023-07-30 DIAGNOSIS — E7849 Other hyperlipidemia: Secondary | ICD-10-CM | POA: Diagnosis not present

## 2023-07-30 DIAGNOSIS — M5432 Sciatica, left side: Secondary | ICD-10-CM | POA: Diagnosis not present

## 2023-07-30 DIAGNOSIS — E038 Other specified hypothyroidism: Secondary | ICD-10-CM

## 2023-07-30 DIAGNOSIS — R7301 Impaired fasting glucose: Secondary | ICD-10-CM

## 2023-07-30 DIAGNOSIS — I10 Essential (primary) hypertension: Secondary | ICD-10-CM

## 2023-07-30 DIAGNOSIS — E559 Vitamin D deficiency, unspecified: Secondary | ICD-10-CM | POA: Diagnosis not present

## 2023-07-30 MED ORDER — GABAPENTIN 100 MG PO CAPS: 100.0000 mg | ORAL_CAPSULE | Freq: Every day | ORAL | 3 refills | Status: DC

## 2023-07-30 NOTE — Progress Notes (Signed)
Established Patient Office Visit  Subjective:  Patient ID: Natasha Willis, female    DOB: [birth year]  Age: 82 y.o. MRN: [id number]  CC:  Chief Complaint  Patient presents with   follow up    3 month follow up. Still having pain in her left hip that goes down her whole leg into her toes    HPI Natasha Willis is a 82 y.o. female with past medical history of sciatica, hypothyroidism, essential hypertension presents for f/u of  chronic medical conditions. For the details of today's visit, please refer to the assessment and plan.     Past Medical History:  Diagnosis Date   Asthma    Diverticulitis    Hypertension    Thyroid disease     Past Surgical History:  Procedure Laterality Date   ABDOMINAL HYSTERECTOMY     ANTERIOR AND POSTERIOR REPAIR     APPENDECTOMY     CATARACT EXTRACTION W/PHACO Right 08/19/2014   Procedure: CATARACT EXTRACTION PHACO AND INTRAOCULAR LENS PLACEMENT RIGHT EYE;  Surgeon: Gemma Payor, MD;  Location: [facility];  Service: Ophthalmology;  Laterality: Right;  CDE:7.60   CATARACT EXTRACTION W/PHACO Left 08/30/2014   Procedure: CATARACT EXTRACTION PHACO AND INTRAOCULAR LENS PLACEMENT (IOC);  Surgeon: Gemma Payor, MD;  Location: [facility];  Service: Ophthalmology;  Laterality: Left;  CDE:5.49   CHOLECYSTECTOMY     TUBAL LIGATION      Family History  Problem Relation Age of Onset   Diabetes Brother    Heart disease Brother    Diabetes Brother    Heart disease Brother     Social History   Socioeconomic History   Marital status: Legally Separated    Spouse name: Not on file   Number of children: 5   Years of education: Not on file   Highest education level: 10th grade  Occupational History   Not on file  Tobacco Use   Smoking status: Never   Smokeless tobacco: Never  Substance and Sexual Activity   Alcohol use: No   Drug use: No   Sexual activity: Not Currently    Birth control/protection: Surgical  Other Topics Concern   Not on file  Social  History Narrative   Lives alone   5 children, one lives 2 doors down.   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   No weapons   Social Determinants of Health   Financial Resource Strain: Low Risk  (07/28/2023)   Overall Financial Resource Strain (CARDIA)    Difficulty of Paying Living Expenses: Not hard at all  Food Insecurity: No Food Insecurity (07/28/2023)   Hunger Vital Sign    Worried About Running Out of Food in the Last Year: Never true    Ran Out of Food in the Last Year: Never true  Transportation Needs: No Transportation Needs (07/28/2023)   PRAPARE - Administrator, Civil Service (Medical): No    Lack of Transportation (Non-Medical): No  Physical Activity: Insufficiently Active (07/28/2023)   Exercise Vital Sign    Days of Exercise per Week: 3 days    Minutes of Exercise per Session: 20 min  Stress: No  Stress Concern Present (07/28/2023)   [employer] of Occupational Health - Occupational Stress Questionnaire    Feeling of Stress : Not at all  Social Connections: Unknown (07/28/2023)   Social Connection and Isolation Panel [NHANES]    Frequency of Communication with Friends and Family: More than three times a week    Frequency of Social Gatherings with Friends and Family: Three times a week    Attends Religious Services: 1 to 4 times per year    Active Member of Clubs or Organizations: No    Attends Banker Meetings: Not on file    Marital Status: Patient declined  Intimate Partner Violence: Not At Risk (03/24/2020)   Humiliation, Afraid, Rape, and Kick questionnaire    Fear of Current or Ex-Partner: No    Emotionally Abused: No    Physically Abused: No    Sexually Abused: No    Outpatient Medications Prior to  Visit  Medication Sig Dispense Refill   carboxymethylcellulose 1 % ophthalmic solution 1 drop 3 (three) times daily.     Cholecalciferol (VITAMIN D) 50 MCG (2000 UT) CAPS Take by mouth. Take one capsule daily     denosumab (PROLIA) 60 MG/ML SOSY injection Inject 60 mg into the skin every 6 (six) months. 1 mL 1   Fish Oil-Cholecalciferol (FISH OIL + D3) 1000-1000 MG-UNIT CAPS Take 1 capsule by mouth daily.      Iron, Ferrous Sulfate, 325 (65 Fe) MG TABS Take 325 mg by mouth daily. 30 tablet 2   Multiple Vitamins-Minerals (CENTRAL-VITE FOR SENIORS PO) Take 1 tablet by mouth daily.      No facility-administered medications prior to visit.    Allergies  Allergen Reactions   Penicillins     ROS Review of Systems  Constitutional:  Negative for chills and fever.  Eyes:  Negative for visual disturbance.  Respiratory:  Negative for chest tightness and shortness of breath.   Neurological:  Negative for dizziness and headaches.      Objective:    Physical Exam HENT:     Head: Normocephalic.     Mouth/Throat:     Mouth: Mucous membranes are moist.  Cardiovascular:     Rate and Rhythm: Normal rate.     Heart sounds: Normal heart sounds.  Pulmonary:     Effort: Pulmonary effort is normal.     Breath sounds: Normal breath sounds.  Neurological:     Mental Status: She is alert.     BP 126/63   Pulse 91   Resp 16   Ht 5\' 1"  (1.549 m)   Wt 153 lb 6.4 oz (69.6 kg)   SpO2 98%   BMI 28.98 kg/m  Wt Readings from Last 3 Encounters:  07/30/23 153 lb 6.4 oz (69.6 kg)  05/28/23 160 lb (72.6 kg)  04/22/23 160 lb 0.6 oz (72.6 kg)    Lab Results  Component Value Date   TSH 4.850 (H) 04/22/2023   Lab Results  Component Value Date   WBC 9.5 04/22/2023   HGB 7.8 (L) 04/22/2023   HCT 27.8 (L) 04/22/2023   MCV 72 (L) 04/22/2023   PLT 399 04/22/2023   Lab Results  Component Value Date   NA 141 04/22/2023   K 4.3 04/22/2023   CO2 19 (L) 04/22/2023   GLUCOSE 134 (H) 04/22/2023    BUN 12 04/22/2023   CREATININE 0.51 (L) 04/22/2023   BILITOT 0.7 04/22/2023   ALKPHOS 116 04/22/2023   AST 16 04/22/2023   ALT  6 04/22/2023   PROT 7.5 04/22/2023   ALBUMIN 3.8 04/22/2023   CALCIUM 9.7 04/22/2023   ANIONGAP 11 08/17/2014   EGFR 94 04/22/2023   Lab Results  Component Value Date   CHOL 139 04/22/2023   Lab Results  Component Value Date   HDL 50 04/22/2023   Lab Results  Component Value Date   LDLCALC 69 04/22/2023   Lab Results  Component Value Date   TRIG 108 04/22/2023   Lab Results  Component Value Date   CHOLHDL 2.8 04/22/2023   Lab Results  Component Value Date   HGBA1C 5.8 (H) 04/22/2023      Assessment & Plan:  Essential hypertension Assessment & Plan: Controlled Asymptomatic in the clinic Not on medications Encouraged lifestyle modification with low-sodium diet and increase physical activity BP Readings from Last 3 Encounters:  07/30/23 126/63  05/28/23 (!) 110/52  04/30/23 109/72      Sciatica of left side Assessment & Plan: The patient reports pain beginning in her left hip and radiating down her leg, rated at 8 out of 10 and described as achy. She has been performing supportive care and is interested in starting pharmacological therapy today. She was encouraged to apply over-the-counter Advil Targeted Relief pain-relieving cream to the affected area. Gabapentin 100 mg at bedtime was recommended, along with performing the exercises provided in the After Visit Summary (AVS) to help relieve sciatica pain. Over-the-counter Tylenol may also be taken as needed for additional pain relief.   Orders: -     Gabapentin; Take 1 capsule (100 mg total) by mouth at bedtime.  Dispense: 90 capsule; Refill: 3  Other specified hypothyroidism Assessment & Plan: Currently not on medications Pending thyroid levels Lab Results  Component Value Date   TSH 4.850 (H) 04/22/2023      IFG (impaired fasting glucose) -     Hemoglobin A1c  Vitamin  D deficiency -     VITAMIN D 25 Hydroxy (Vit-D Deficiency, Fractures)  TSH (thyroid-stimulating hormone deficiency) -     TSH + free T4  Other hyperlipidemia -     Lipid panel -     CMP14+EGFR -     CBC with Differential/Platelet   Note: This chart has been completed using Engineer, civil (consulting) software, and while attempts have been made to ensure accuracy, certain words and phrases may not be transcribed as intended.   Follow-up: Return in about 4 months (around 11/27/2023).   Gilmore Laroche, FNP

## 2023-07-30 NOTE — Assessment & Plan Note (Signed)
Currently not on medications Pending thyroid levels Lab Results  Component Value Date   TSH 4.850 (H) 04/22/2023

## 2023-07-30 NOTE — Assessment & Plan Note (Signed)
The patient reports pain beginning in her left hip and radiating down her leg, rated at 8 out of 10 and described as achy. She has been performing supportive care and is interested in starting pharmacological therapy today. She was encouraged to apply over-the-counter Advil Targeted Relief pain-relieving cream to the affected area. Gabapentin 100 mg at bedtime was recommended, along with performing the exercises provided in the After Visit Summary (AVS) to help relieve sciatica pain. Over-the-counter Tylenol may also be taken as needed for additional pain relief.

## 2023-07-30 NOTE — Patient Instructions (Addendum)
I appreciate the opportunity to provide care to you today!    Follow up:  4 months  Labs: please stop by the lab today to get your blood drawn (CBC, CMP, TSH, Lipid profile, HgA1c, Vit D)  Sciatica, Left Side: -Apply over-the-counter Advil Targeted Relief pain-relieving cream (topical)  to the affected site. Take gabapentin 100 mg at bedtime. Perform the exercises attached to the After Visit Summary (AVS) to help relieve sciatica pain. Over-the-counter Tylenol may be taken as needed for additional pain relief.  Attached with your AVS, you will find valuable resources for self-education. I highly recommend dedicating some time to thoroughly examine them.   Please continue to a heart-healthy diet and increase your physical activities. Try to exercise for at least five days a week.    It was a pleasure to see you and I look forward to continuing to work together on your health and well-being. Please do not hesitate to call the office if you need care or have questions about your care.  In case of emergency, please visit the Emergency Department for urgent care, or contact our clinic at [phone] to schedule an appointment. We're here to help you!   Have a wonderful day and week. With Gratitude, Gilmore Laroche MSN, FNP-BC

## 2023-07-30 NOTE — Assessment & Plan Note (Signed)
Controlled Asymptomatic in the clinic Not on medications Encouraged lifestyle modification with low-sodium diet and increase physical activity BP Readings from Last 3 Encounters:  07/30/23 126/63  05/28/23 (!) 110/52  04/30/23 109/72

## 2023-07-31 LAB — CBC WITH DIFFERENTIAL/PLATELET
Basophils Absolute: 0.1 10*3/uL (ref 0.0–0.2)
Basos: 1 %
EOS (ABSOLUTE): 0.2 10*3/uL (ref 0.0–0.4)
Eos: 3 %
Hematocrit: 39.4 % (ref 34.0–46.6)
Hemoglobin: 11.9 g/dL (ref 11.1–15.9)
Immature Grans (Abs): 0 10*3/uL (ref 0.0–0.1)
Immature Granulocytes: 0 %
Lymphocytes Absolute: 1 10*3/uL (ref 0.7–3.1)
Lymphs: 14 %
MCH: 26.2 pg — ABNORMAL LOW (ref 26.6–33.0)
MCHC: 30.2 g/dL — ABNORMAL LOW (ref 31.5–35.7)
MCV: 87 fL (ref 79–97)
Monocytes Absolute: 0.9 10*3/uL (ref 0.1–0.9)
Monocytes: 13 %
Neutrophils Absolute: 5.2 10*3/uL (ref 1.4–7.0)
Neutrophils: 69 %
Platelets: 353 10*3/uL (ref 150–450)
RBC: 4.55 x10E6/uL (ref 3.77–5.28)
RDW: 15.9 % — ABNORMAL HIGH (ref 11.7–15.4)
WBC: 7.4 10*3/uL (ref 3.4–10.8)

## 2023-07-31 LAB — LIPID PANEL
Chol/HDL Ratio: 3.3 ratio (ref 0.0–4.4)
Cholesterol, Total: 157 mg/dL (ref 100–199)
HDL: 48 mg/dL (ref >39)
LDL Chol Calc (NIH): 92 mg/dL (ref 0–99)
Triglycerides: 89 mg/dL (ref 0–149)
VLDL Cholesterol Cal: 17 mg/dL (ref 5–40)

## 2023-07-31 LAB — CMP14+EGFR
ALT: 7 [IU]/L (ref 0–32)
AST: 35 [IU]/L (ref 0–40)
Albumin: 3.6 g/dL — ABNORMAL LOW (ref 3.7–4.7)
Alkaline Phosphatase: 86 [IU]/L (ref 44–121)
BUN/Creatinine Ratio: 29 — ABNORMAL HIGH (ref 12–28)
BUN: 15 mg/dL (ref 8–27)
Bilirubin Total: 0.5 mg/dL (ref 0.0–1.2)
CO2: 23 mmol/L (ref 20–29)
Calcium: 9.7 mg/dL (ref 8.7–10.3)
Chloride: 103 mmol/L (ref 96–106)
Creatinine, Ser: 0.52 mg/dL — ABNORMAL LOW (ref 0.57–1.00)
Globulin, Total: 3.6 g/dL (ref 1.5–4.5)
Glucose: 103 mg/dL — ABNORMAL HIGH (ref 70–99)
Potassium: 4.6 mmol/L (ref 3.5–5.2)
Sodium: 139 mmol/L (ref 134–144)
Total Protein: 7.2 g/dL (ref 6.0–8.5)
eGFR: 93 mL/min/{1.73_m2} (ref >59)

## 2023-07-31 LAB — TSH+FREE T4
Free T4: 1.04 ng/dL (ref 0.82–1.77)
TSH: 7.89 u[IU]/mL — ABNORMAL HIGH (ref 0.450–4.500)

## 2023-07-31 LAB — HEMOGLOBIN A1C
Est. average glucose Bld gHb Est-mCnc: 117 mg/dL
Hgb A1c MFr Bld: 5.7 % — ABNORMAL HIGH (ref 4.8–5.6)

## 2023-07-31 LAB — VITAMIN D 25 HYDROXY (VIT D DEFICIENCY, FRACTURES): Vit D, 25-Hydroxy: 53 ng/mL (ref 30.0–100.0)

## 2023-08-02 ENCOUNTER — Other Ambulatory Visit: Payer: Self-pay | Admitting: Family Medicine

## 2023-08-02 DIAGNOSIS — E038 Other specified hypothyroidism: Secondary | ICD-10-CM

## 2023-10-01 ENCOUNTER — Other Ambulatory Visit: Payer: Self-pay

## 2023-10-02 ENCOUNTER — Other Ambulatory Visit: Payer: Self-pay

## 2023-10-02 NOTE — Progress Notes (Signed)
 Specialty Pharmacy Refill Coordination Note  Natasha Willis is a 83 y.o. female contacted today regarding refills of specialty medication(s) Denosumab  (Prolia )   Patient requested Courier to Provider Office   Delivery date: 10/16/23   Verified address: [name] [address], [place], [address]   Medication will be filled on 10/15/23.

## 2023-10-08 ENCOUNTER — Ambulatory Visit: Payer: Medicare HMO | Admitting: Orthopaedic Surgery

## 2023-10-09 ENCOUNTER — Ambulatory Visit: Payer: Medicare HMO | Admitting: Orthopaedic Surgery

## 2023-10-10 ENCOUNTER — Encounter: Payer: Self-pay | Admitting: Orthopaedic Surgery

## 2023-10-10 ENCOUNTER — Ambulatory Visit: Payer: Medicare HMO | Admitting: Orthopaedic Surgery

## 2023-10-10 VITALS — BP 136/80 | HR 96

## 2023-10-10 DIAGNOSIS — M8448XD Pathological fracture, other site, subsequent encounter for fracture with routine healing: Secondary | ICD-10-CM

## 2023-10-10 DIAGNOSIS — M8000XD Age-related osteoporosis with current pathological fracture, unspecified site, subsequent encounter for fracture with routine healing: Secondary | ICD-10-CM | POA: Diagnosis not present

## 2023-10-10 MED ORDER — HYDROCODONE-ACETAMINOPHEN 5-325 MG PO TABS: ORAL_TABLET | ORAL | 0 refills | Status: DC

## 2023-10-10 NOTE — Progress Notes (Signed)
My back hurts  She has chronic pain of the lower back. She is a little better than the last time I saw her.  She has no new trauma.  She is moving about better but still uses a wheelchair at times.  I will set her up for osteoporosis care with the [facility] office and Chales Abrahams.  She is taking Vitamin D, level is 53.  She has no weakness.  NV intact.  ROM of back is tender.  Muscle tone and strength is normal.  Encounter Diagnoses  Name Primary?   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  I will see her in three months.  I have reviewed the [place] Controlled Substance Reporting System web site prior to prescribing narcotic medicine for this patient.  Call if any problem.  Precautions discussed.  Electronically Signed Darreld Mclean, MD 1/23/20259:29 AM

## 2023-10-10 NOTE — Patient Instructions (Signed)
We are referring you to [facility] from [facility] address is [address] [place] The phone number is [phone]  The office will call you with an appointment Janett Labella will see you in the osteoporosis clinic

## 2023-10-15 ENCOUNTER — Other Ambulatory Visit: Payer: Self-pay

## 2023-10-15 ENCOUNTER — Other Ambulatory Visit (HOSPITAL_COMMUNITY): Payer: Self-pay

## 2023-10-21 ENCOUNTER — Ambulatory Visit: Payer: Medicare Other | Admitting: "Endocrinology

## 2023-10-28 ENCOUNTER — Encounter: Payer: Self-pay | Admitting: Physician Assistant

## 2023-10-28 ENCOUNTER — Ambulatory Visit (INDEPENDENT_AMBULATORY_CARE_PROVIDER_SITE_OTHER): Payer: Medicare HMO | Admitting: Physician Assistant

## 2023-10-28 DIAGNOSIS — M810 Age-related osteoporosis without current pathological fracture: Secondary | ICD-10-CM | POA: Diagnosis not present

## 2023-10-28 NOTE — Progress Notes (Signed)
 Office Visit Note   Patient: Natasha Willis           Date of Birth: [birth year]           MRN: [id number] Visit Date: 10/28/2023              Requested by: Pleasant Brilliant, MD [address] [place],  [place] [address] PCP: Zarwolo, Gloria, FNP   Assessment & Plan: Visit Diagnoses: Osteoporosis  Plan: Breale is a pleasant 83 year old woman who presents today as a referral from Dr. Iline Mallory to do Discuss osteoporosis.  She does have a history of multiple vertebral fragility fractures.  She also has a history of polio.  She has no history of cancer he she does have some kidney disease no history of ulcers no bypass surgery no reflux.  She did go through menopause at 38 and did not take any hormone replacement therapy.  She has adequate calcium and vitamin D  stores at her current labs.  She does walking but no strength training.  She was started on Prolia  by her primary care and I think this is an excellent place for her to start.  My recommendation to her and her family is to continue with the Prolia  and get a repeat DEXA scan when she is done the Prolia  for about a year for medication monitoring.  We talked about other things that I think would be important to her.  Certainly getting into a chair exercise class to strengthen her and help her with her balance.  I provided to her family both information about vitamin D  and calcium as well as information about good sources of iron  vitamin D  and calcium.  May follow-up with me as needed  Follow-Up Instructions: Return if symptoms worsen or fail to improve.   Orders:  No orders of the defined types were placed in this encounter.  No orders of the defined types were placed in this encounter.     Procedures: No procedures performed   Clinical Data: No additional findings.   Subjective: No chief complaint on file.   HPI patient is a pleasant 83 year old woman referred from Dr. Iline Mallory for evaluation of osteoporosis.  She was  recently started on Prolia  by her primary care provider.  She has multiple compression fractures also history of kidney disease and menopause at a young age  Review of Systems  All other systems reviewed and are negative.    Objective: Vital Signs: There were no vitals taken for this visit.  Physical Exam Constitutional:      Appearance: Normal appearance.  HENT:     Head: Normocephalic.  Pulmonary:     Effort: Pulmonary effort is normal.  Skin:    General: Skin is warm and dry.  Neurological:     General: No focal deficit present.     Mental Status: She is alert and oriented to person, place, and time.  Psychiatric:        Mood and Affect: Mood normal.        Behavior: Behavior normal.       Specialty Comments:  No specialty comments available.  Imaging: No results found.   PMFS History: Patient Active Problem List   Diagnosis Date Noted   Rash 04/22/2023   Compression fracture of L2 lumbar vertebra, closed, initial encounter (HCC) 04/09/2023   Lumbar pain 04/09/2023   Age-related osteoporosis without current pathological fracture 08/03/2021   Fall 07/04/2021   Sciatica 02/28/2021   Impaired fasting glucose 10/26/2020  Hypothyroidism 01/04/2020   Essential hypertension 01/04/2020   Diverticulitis 06/22/2019   Prediabetes 06/22/2019   Allergies 06/22/2019   Encounter for general adult medical examination with abnormal findings 06/22/2019   Past Medical History:  Diagnosis Date   Asthma    Diverticulitis    Hypertension    Thyroid  disease     Family History  Problem Relation Age of Onset   Diabetes Brother    Heart disease Brother    Diabetes Brother    Heart disease Brother     Past Surgical History:  Procedure Laterality Date   ABDOMINAL HYSTERECTOMY     ANTERIOR AND POSTERIOR REPAIR     APPENDECTOMY     CATARACT EXTRACTION W/PHACO Right 08/19/2014   Procedure: CATARACT EXTRACTION PHACO AND INTRAOCULAR LENS PLACEMENT RIGHT EYE;  Surgeon:  Anner Kill, MD;  Location: [facility];  Service: Ophthalmology;  Laterality: Right;  CDE:7.60   CATARACT EXTRACTION W/PHACO Left 08/30/2014   Procedure: CATARACT EXTRACTION PHACO AND INTRAOCULAR LENS PLACEMENT (IOC);  Surgeon: Anner Kill, MD;  Location: [facility];  Service: Ophthalmology;  Laterality: Left;  CDE:5.49   CHOLECYSTECTOMY     TUBAL LIGATION     Social History   Occupational History   Not on file  Tobacco Use   Smoking status: Never   Smokeless tobacco: Never  Substance and Sexual Activity   Alcohol use: No   Drug use: No   Sexual activity: Not Currently    Birth control/protection: Surgical

## 2023-11-04 DIAGNOSIS — H52223 Regular astigmatism, bilateral: Secondary | ICD-10-CM | POA: Diagnosis not present

## 2023-11-04 LAB — HM DIABETES EYE EXAM

## 2023-11-07 ENCOUNTER — Telehealth: Payer: Self-pay | Admitting: "Endocrinology

## 2023-11-07 DIAGNOSIS — M810 Age-related osteoporosis without current pathological fracture: Secondary | ICD-10-CM

## 2023-11-07 DIAGNOSIS — E039 Hypothyroidism, unspecified: Secondary | ICD-10-CM

## 2023-11-07 NOTE — Telephone Encounter (Signed)
 Labs updated and sent to [facility].

## 2023-11-07 NOTE — Telephone Encounter (Signed)
 Labs need to be updated.

## 2023-11-11 ENCOUNTER — Other Ambulatory Visit: Payer: Self-pay

## 2023-11-11 DIAGNOSIS — M810 Age-related osteoporosis without current pathological fracture: Secondary | ICD-10-CM

## 2023-11-11 MED ORDER — PROLIA 60 MG/ML ~~LOC~~ SOSY
60.0000 mg | PREFILLED_SYRINGE | SUBCUTANEOUS | 0 refills | Status: DC
  Filled 2023-11-11 – 2024-05-11 (×2): qty 1, 180d supply, fill #0

## 2023-11-13 ENCOUNTER — Telehealth: Payer: Self-pay

## 2023-11-13 NOTE — Telephone Encounter (Signed)
 Tried to return call to pt's daughter Louis Matte), had to leave a message requesting a return call to the office.

## 2023-11-20 ENCOUNTER — Ambulatory Visit (INDEPENDENT_AMBULATORY_CARE_PROVIDER_SITE_OTHER)

## 2023-11-20 ENCOUNTER — Ambulatory Visit: Payer: Medicare Other | Admitting: "Endocrinology

## 2023-11-20 VITALS — BP 128/66 | HR 84 | Ht 61.0 in | Wt 148.2 lb

## 2023-11-20 DIAGNOSIS — M810 Age-related osteoporosis without current pathological fracture: Secondary | ICD-10-CM | POA: Diagnosis not present

## 2023-11-20 MED ORDER — DENOSUMAB 60 MG/ML ~~LOC~~ SOSY
60.0000 mg | PREFILLED_SYRINGE | Freq: Once | SUBCUTANEOUS | Status: AC
  Administered 2023-11-20: 60 mg via SUBCUTANEOUS

## 2023-11-20 NOTE — Progress Notes (Signed)
 Pt seen for nurse visit for Prolia injection. Pt provided her Prolia. Prolia 60mg  given SQ in upper L arm without difficulty. Lot #[id number], Exp. Date 04/16/2026. Pt waited the required 15 minutes post injection, no adverse reaction noted.

## 2023-11-26 ENCOUNTER — Ambulatory Visit (HOSPITAL_COMMUNITY)
Admission: RE | Admit: 2023-11-26 | Discharge: 2023-11-26 | Disposition: A | Discharge status: Home / Self Care | Source: Ambulatory Visit | Attending: "Endocrinology | Admitting: "Endocrinology

## 2023-11-26 DIAGNOSIS — M810 Age-related osteoporosis without current pathological fracture: Secondary | ICD-10-CM | POA: Insufficient documentation

## 2023-11-26 DIAGNOSIS — Z780 Asymptomatic menopausal state: Secondary | ICD-10-CM | POA: Diagnosis not present

## 2023-11-29 ENCOUNTER — Encounter: Payer: Self-pay | Admitting: Family Medicine

## 2023-11-29 ENCOUNTER — Ambulatory Visit (INDEPENDENT_AMBULATORY_CARE_PROVIDER_SITE_OTHER): Payer: Medicare HMO | Admitting: Family Medicine

## 2023-11-29 VITALS — BP 138/76 | HR 85 | Resp 16 | Ht 61.0 in | Wt 146.4 lb

## 2023-11-29 DIAGNOSIS — I10 Essential (primary) hypertension: Secondary | ICD-10-CM | POA: Diagnosis not present

## 2023-11-29 DIAGNOSIS — R7301 Impaired fasting glucose: Secondary | ICD-10-CM

## 2023-11-29 DIAGNOSIS — M5430 Sciatica, unspecified side: Secondary | ICD-10-CM

## 2023-11-29 DIAGNOSIS — E559 Vitamin D deficiency, unspecified: Secondary | ICD-10-CM

## 2023-11-29 DIAGNOSIS — E038 Other specified hypothyroidism: Secondary | ICD-10-CM

## 2023-11-29 DIAGNOSIS — E7849 Other hyperlipidemia: Secondary | ICD-10-CM

## 2023-11-29 MED ORDER — TIZANIDINE HCL 4 MG PO TABS: 4.0000 mg | ORAL_TABLET | Freq: Four times a day (QID) | ORAL | 0 refills | Status: AC | PRN

## 2023-11-29 NOTE — Progress Notes (Signed)
 Established Patient Office Visit  Subjective:  Patient ID: Natasha Willis, female    DOB: [birth year]  Age: 83 y.o. MRN: [id number]  CC:  Chief Complaint  Patient presents with   Follow-up    States she found old tizanidine and she's been taking it for the sciatic pain and its helping. The gabapentin didn't help at all     HPI Natasha Willis is a 83 y.o. female with past medical history of essential hypertension, hypothyroidism and sciatica presents for f/u of  chronic medical conditions.  For the details of today's visit, please refer to the assessment and plan.    The patient declines the Tdap and pneumonia vaccines.  Past Medical History:  Diagnosis Date   Asthma    Diverticulitis    Hypertension    Thyroid disease     Past Surgical History:  Procedure Laterality Date   ABDOMINAL HYSTERECTOMY     ANTERIOR AND POSTERIOR REPAIR     APPENDECTOMY     CATARACT EXTRACTION W/PHACO Right 08/19/2014   Procedure: CATARACT EXTRACTION PHACO AND INTRAOCULAR LENS PLACEMENT RIGHT EYE;  Surgeon: Gemma Payor, MD;  Location: [facility];  Service: Ophthalmology;  Laterality: Right;  CDE:7.60   CATARACT EXTRACTION W/PHACO Left 08/30/2014   Procedure: CATARACT EXTRACTION PHACO AND INTRAOCULAR LENS PLACEMENT (IOC);  Surgeon: Gemma Payor, MD;  Location: [facility];  Service: Ophthalmology;  Laterality: Left;  CDE:5.49   CHOLECYSTECTOMY     TUBAL LIGATION      Family History  Problem Relation Age of Onset   Diabetes Brother    Heart disease Brother    Diabetes Brother    Heart disease Brother     Social History   Socioeconomic History   Marital status: Legally Separated    Spouse name: Not on file   Number of children: 5   Years of education: Not on file   Highest education level: 10th grade  Occupational History   Not on file  Tobacco Use   Smoking status: Never   Smokeless tobacco: Never  Substance and Sexual Activity   Alcohol use: No   Drug use: No   Sexual activity: Not  Currently    Birth control/protection: Surgical  Other Topics Concern   Not on file  Social History Narrative   Lives alone   5 children, one lives 2 doors down.   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   No weapons   Social Drivers of Health   Financial Resource Strain: Low Risk  (11/25/2023)   Overall Financial Resource Strain (CARDIA)    Difficulty of Paying Living Expenses: Not hard at all  Food Insecurity: No Food Insecurity (11/25/2023)   Hunger Vital Sign    Worried About Running Out of Food in the Last Year: Never true    Ran Out of Food in the Last Year: Never true  Transportation Needs: No Transportation Needs (11/25/2023)   PRAPARE - Administrator, Civil Service (Medical): No    Lack of Transportation (Non-Medical): No  Physical Activity: Insufficiently Active (11/25/2023)   Exercise Vital Sign    Days of Exercise per Week: 4 days  Minutes of Exercise per Session: 20 min  Stress: No Stress Concern Present (11/25/2023)   [employer] of Occupational Health - Occupational Stress Questionnaire    Feeling of Stress : Not at all  Social Connections: Unknown (11/25/2023)   Social Connection and Isolation Panel [NHANES]    Frequency of Communication with Friends and Family: More than three times a week    Frequency of Social Gatherings with Friends and Family: Three times a week    Attends Religious Services: More than 4 times per year    Active Member of Clubs or Organizations: Patient declined    Attends Banker Meetings: Not on file    Marital Status: Patient declined  Intimate Partner Violence: Not At Risk (03/24/2020)   Humiliation, Afraid, Rape, and Kick questionnaire    Fear of Current or Ex-Partner: No     Emotionally Abused: No    Physically Abused: No    Sexually Abused: No    Outpatient Medications Prior to Visit  Medication Sig Dispense Refill   carboxymethylcellulose 1 % ophthalmic solution 1 drop 3 (three) times daily.     Cholecalciferol (VITAMIN D) 50 MCG (2000 UT) CAPS Take by mouth. Take one capsule daily     denosumab (PROLIA) 60 MG/ML SOSY injection Inject 60 mg into the skin every 6 (six) months. 1 mL 0   Fish Oil-Cholecalciferol (FISH OIL + D3) 1000-1000 MG-UNIT CAPS Take 1 capsule by mouth daily.      HYDROcodone-acetaminophen (NORCO/VICODIN) 5-325 MG tablet One tablet every four hours for pain. 30 tablet 0   Iron, Ferrous Sulfate, 325 (65 Fe) MG TABS Take 325 mg by mouth daily. 30 tablet 2   Multiple Vitamins-Minerals (CENTRAL-VITE FOR SENIORS PO) Take 1 tablet by mouth daily.      gabapentin (NEURONTIN) 100 MG capsule Take 1 capsule (100 mg total) by mouth at bedtime. (Patient not taking: Reported on 11/29/2023) 90 capsule 3   No facility-administered medications prior to visit.    Allergies  Allergen Reactions   Penicillins     ROS Review of Systems  Constitutional:  Negative for chills and fever.  Eyes:  Negative for visual disturbance.  Respiratory:  Negative for chest tightness and shortness of breath.   Neurological:  Negative for dizziness and headaches.      Objective:    Physical Exam HENT:     Head: Normocephalic.     Mouth/Throat:     Mouth: Mucous membranes are moist.  Cardiovascular:     Rate and Rhythm: Normal rate.     Heart sounds: Normal heart sounds.  Pulmonary:     Effort: Pulmonary effort is normal.     Breath sounds: Normal breath sounds.  Neurological:     Mental Status: She is alert.     BP 138/76   Pulse 85   Resp 16   Ht 5\' 1"  (1.549 m)   Wt 146 lb 6.4 oz (66.4 kg)   SpO2 95%   BMI 27.66 kg/m  Wt Readings from Last 3 Encounters:  11/29/23 146 lb 6.4 oz (66.4 kg)  11/20/23 148 lb 3.2 oz (67.2 kg)  07/30/23 153 lb 6.4  oz (69.6 kg)    Lab Results  Component Value Date   TSH 2.770 11/29/2023   Lab Results  Component Value Date   WBC 8.4 11/29/2023   HGB 12.0 11/29/2023   HCT 37.8 11/29/2023   MCV 89 11/29/2023   PLT 321 11/29/2023   Lab Results  Component Value  Date   NA 136 11/29/2023   K 5.0 11/29/2023   CO2 21 11/29/2023   GLUCOSE 119 (H) 11/29/2023   BUN 15 11/29/2023   CREATININE 0.59 11/29/2023   BILITOT 0.6 11/29/2023   ALKPHOS 105 11/29/2023   AST 64 (H) 11/29/2023   ALT 7 11/29/2023   PROT 7.1 11/29/2023   ALBUMIN 3.6 (L) 11/29/2023   CALCIUM 9.9 11/29/2023   ANIONGAP 11 08/17/2014   EGFR 90 11/29/2023   Lab Results  Component Value Date   CHOL 132 11/29/2023   Lab Results  Component Value Date   HDL 48 11/29/2023   Lab Results  Component Value Date   LDLCALC 69 11/29/2023   Lab Results  Component Value Date   TRIG 72 11/29/2023   Lab Results  Component Value Date   CHOLHDL 2.8 11/29/2023   Lab Results  Component Value Date   HGBA1C 5.2 11/29/2023      Assessment & Plan:  Essential hypertension Assessment & Plan: Controlled Asymptomatic in the clinic Not on medications Encouraged lifestyle modification with low-sodium diet and increase physical activity BP Readings from Last 3 Encounters:  11/29/23 138/76  11/20/23 128/66  10/10/23 136/80      Other specified hypothyroidism Assessment & Plan: Currently not on medications Pending thyroid levels Lab Results  Component Value Date   TSH 2.770 11/29/2023      Sciatica, unspecified laterality Assessment & Plan: Refilled tizanidine 4 mg to take every 6 hours as needed  Orders: -     tiZANidine HCl; Take 1 tablet (4 mg total) by mouth every 6 (six) hours as needed for muscle spasms.  Dispense: 60 tablet; Refill: 0  IFG (impaired fasting glucose) -     Hemoglobin A1c  Vitamin D deficiency -     VITAMIN D 25 Hydroxy (Vit-D Deficiency, Fractures)  TSH (thyroid-stimulating hormone  deficiency) -     TSH + free T4  Other hyperlipidemia -     Lipid panel -     CMP14+EGFR -     CBC with Differential/Platelet  Note: This chart has been completed using Engineer, civil (consulting) software, and while attempts have been made to ensure accuracy, certain words and phrases may not be transcribed as intended.    Follow-up: Return in about 4 months (around 03/30/2024).   Gilmore Laroche, FNP

## 2023-11-29 NOTE — Patient Instructions (Addendum)
 I appreciate the opportunity to provide care to you today!    Follow up:  4 months  Labs: please stop by the lab today to get your blood drawn (CBC, CMP, TSH, Lipid profile, HgA1c, Vit D)  Schedule medicare annual wellness visit  Attached with your AVS, you will find valuable resources for self-education. I highly recommend dedicating some time to thoroughly examine them.   Please continue to a heart-healthy diet and increase your physical activities. Try to exercise for at least five days a week.    It was a pleasure to see you and I look forward to continuing to work together on your health and well-being. Please do not hesitate to call the office if you need care or have questions about your care.  In case of emergency, please visit the Emergency Department for urgent care, or contact our clinic at [phone] to schedule an appointment. We're here to help you!   Have a wonderful day and week. With Gratitude, Gilmore Laroche MSN, FNP-BC

## 2023-11-30 ENCOUNTER — Encounter: Payer: Self-pay | Admitting: Family Medicine

## 2023-11-30 LAB — CMP14+EGFR
ALT: 7 IU/L (ref 0–32)
AST: 60 IU/L — ABNORMAL HIGH (ref 0–40)
Albumin: 3.6 g/dL — ABNORMAL LOW (ref 3.7–4.7)
Alkaline Phosphatase: 107 IU/L (ref 44–121)
BUN/Creatinine Ratio: 27 (ref 12–28)
BUN: 16 mg/dL (ref 8–27)
Bilirubin Total: 0.6 mg/dL (ref 0.0–1.2)
CO2: 20 mmol/L (ref 20–29)
Calcium: 9.8 mg/dL (ref 8.7–10.3)
Chloride: 101 mmol/L (ref 96–106)
Creatinine, Ser: 0.6 mg/dL (ref 0.57–1.00)
Globulin, Total: 3.3 g/dL (ref 1.5–4.5)
Glucose: 116 mg/dL — ABNORMAL HIGH (ref 70–99)
Potassium: 5 mmol/L (ref 3.5–5.2)
Sodium: 136 mmol/L (ref 134–144)
Total Protein: 6.9 g/dL (ref 6.0–8.5)
eGFR: 90 mL/min/{1.73_m2} (ref >59)

## 2023-11-30 LAB — CBC WITH DIFFERENTIAL/PLATELET
Basophils Absolute: 0.1 10*3/uL (ref 0.0–0.2)
Basos: 1 %
EOS (ABSOLUTE): 0.1 10*3/uL (ref 0.0–0.4)
Eos: 2 %
Hematocrit: 37.8 % (ref 34.0–46.6)
Hemoglobin: 12 g/dL (ref 11.1–15.9)
Immature Grans (Abs): 0 10*3/uL (ref 0.0–0.1)
Immature Granulocytes: 0 %
Lymphocytes Absolute: 1.1 10*3/uL (ref 0.7–3.1)
Lymphs: 13 %
MCH: 28.2 pg (ref 26.6–33.0)
MCHC: 31.7 g/dL (ref 31.5–35.7)
MCV: 89 fL (ref 79–97)
Monocytes Absolute: 1 10*3/uL — ABNORMAL HIGH (ref 0.1–0.9)
Monocytes: 12 %
Neutrophils Absolute: 6.1 10*3/uL (ref 1.4–7.0)
Neutrophils: 72 %
Platelets: 321 10*3/uL (ref 150–450)
RBC: 4.25 x10E6/uL (ref 3.77–5.28)
RDW: 14.1 % (ref 11.7–15.4)
WBC: 8.4 10*3/uL (ref 3.4–10.8)

## 2023-11-30 LAB — COMPREHENSIVE METABOLIC PANEL
ALT: 7 IU/L (ref 0–32)
AST: 64 IU/L — ABNORMAL HIGH (ref 0–40)
Albumin: 3.6 g/dL — ABNORMAL LOW (ref 3.7–4.7)
Alkaline Phosphatase: 105 IU/L (ref 44–121)
BUN/Creatinine Ratio: 25 (ref 12–28)
BUN: 15 mg/dL (ref 8–27)
Bilirubin Total: 0.6 mg/dL (ref 0.0–1.2)
CO2: 21 mmol/L (ref 20–29)
Calcium: 9.9 mg/dL (ref 8.7–10.3)
Chloride: 101 mmol/L (ref 96–106)
Creatinine, Ser: 0.59 mg/dL (ref 0.57–1.00)
Globulin, Total: 3.5 g/dL (ref 1.5–4.5)
Glucose: 119 mg/dL — ABNORMAL HIGH (ref 70–99)
Potassium: 5 mmol/L (ref 3.5–5.2)
Sodium: 136 mmol/L (ref 134–144)
Total Protein: 7.1 g/dL (ref 6.0–8.5)
eGFR: 90 mL/min/{1.73_m2} (ref >59)

## 2023-11-30 LAB — LIPID PANEL
Chol/HDL Ratio: 2.8 ratio (ref 0.0–4.4)
Cholesterol, Total: 132 mg/dL (ref 100–199)
HDL: 48 mg/dL (ref >39)
LDL Chol Calc (NIH): 69 mg/dL (ref 0–99)
Triglycerides: 72 mg/dL (ref 0–149)
VLDL Cholesterol Cal: 15 mg/dL (ref 5–40)

## 2023-11-30 LAB — TSH+FREE T4
Free T4: 1.01 ng/dL (ref 0.82–1.77)
TSH: 2.79 u[IU]/mL (ref 0.450–4.500)

## 2023-11-30 LAB — TSH: TSH: 2.77 u[IU]/mL (ref 0.450–4.500)

## 2023-11-30 LAB — PHOSPHORUS: Phosphorus: 2.7 mg/dL — ABNORMAL LOW (ref 3.0–4.3)

## 2023-11-30 LAB — HEMOGLOBIN A1C
Est. average glucose Bld gHb Est-mCnc: 103 mg/dL
Hgb A1c MFr Bld: 5.2 % (ref 4.8–5.6)

## 2023-11-30 LAB — VITAMIN D 25 HYDROXY (VIT D DEFICIENCY, FRACTURES): Vit D, 25-Hydroxy: 76.3 ng/mL (ref 30.0–100.0)

## 2023-11-30 LAB — PTH, INTACT AND CALCIUM: PTH: 32 pg/mL (ref 15–65)

## 2023-11-30 LAB — MAGNESIUM: Magnesium: 2.2 mg/dL (ref 1.6–2.3)

## 2023-11-30 LAB — T4, FREE: Free T4: 0.98 ng/dL (ref 0.82–1.77)

## 2023-11-30 NOTE — Assessment & Plan Note (Signed)
 Controlled Asymptomatic in the clinic Not on medications Encouraged lifestyle modification with low-sodium diet and increase physical activity BP Readings from Last 3 Encounters:  11/29/23 138/76  11/20/23 128/66  10/10/23 136/80

## 2023-11-30 NOTE — Assessment & Plan Note (Signed)
 Refilled tizanidine 4 mg to take every 6 hours as needed

## 2023-11-30 NOTE — Assessment & Plan Note (Signed)
 Currently not on medications Pending thyroid levels Lab Results  Component Value Date   TSH 2.770 11/29/2023

## 2023-12-05 ENCOUNTER — Ambulatory Visit (INDEPENDENT_AMBULATORY_CARE_PROVIDER_SITE_OTHER): Admitting: "Endocrinology

## 2023-12-05 ENCOUNTER — Encounter: Payer: Self-pay | Admitting: "Endocrinology

## 2023-12-05 VITALS — BP 136/82 | HR 80

## 2023-12-05 DIAGNOSIS — M810 Age-related osteoporosis without current pathological fracture: Secondary | ICD-10-CM

## 2023-12-05 NOTE — Progress Notes (Signed)
 12/05/2023, 4:51 PM  Endocrinology follow-up note   Subjective:    Patient ID: Natasha Willis, female    DOB: 25-Sep-1940, PCP Natasha Laroche, FNP   Past Medical History:  Diagnosis Date   Asthma    Diverticulitis    Hypertension    Thyroid disease    Past Surgical History:  Procedure Laterality Date   ABDOMINAL HYSTERECTOMY     ANTERIOR AND POSTERIOR REPAIR     APPENDECTOMY     CATARACT EXTRACTION W/PHACO Right 08/19/2014   Procedure: CATARACT EXTRACTION PHACO AND INTRAOCULAR LENS PLACEMENT RIGHT EYE;  Surgeon: Gemma Payor, MD;  Location: [facility];  Service: Ophthalmology;  Laterality: Right;  CDE:7.60   CATARACT EXTRACTION W/PHACO Left 08/30/2014   Procedure: CATARACT EXTRACTION PHACO AND INTRAOCULAR LENS PLACEMENT (IOC);  Surgeon: Gemma Payor, MD;  Location: [facility];  Service: Ophthalmology;  Laterality: Left;  CDE:5.49   CHOLECYSTECTOMY     TUBAL LIGATION     Social History   Socioeconomic History   Marital status: Legally Separated    Spouse name: Not on file   Number of children: 5   Years of education: Not on file   Highest education level: 10th grade  Occupational History   Not on file  Tobacco Use   Smoking status: Never   Smokeless tobacco: Never  Substance and Sexual Activity   Alcohol use: No   Drug use: No   Sexual activity: Not Currently    Birth control/protection: Surgical  Other Topics Concern   Not on file  Social History Narrative   Lives alone   5 children, one lives 2 doors down.   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   No weapons   Social Drivers of Health   Financial Resource Strain: Low Risk  (11/25/2023)   Overall Financial Resource Strain (CARDIA)     Difficulty of Paying Living Expenses: Not hard at all  Food Insecurity: No Food Insecurity (11/25/2023)   Hunger Vital Sign    Worried About Running Out of Food in the Last Year: Never true    Ran Out of Food in the Last Year: Never true  Transportation Needs: No Transportation Needs (11/25/2023)   PRAPARE - Administrator, Civil Service (Medical): No    Lack of Transportation (Non-Medical): No  Physical Activity: Insufficiently Active (11/25/2023)   Exercise Vital Sign    Days of Exercise per Week: 4 days    Minutes of Exercise per Session: 20 min  Stress: No Stress Concern Present (11/25/2023)   [employer] of Occupational Health - Occupational Stress Questionnaire    Feeling of Stress : Not at all  Social Connections: Unknown (11/25/2023)   Social Connection and Isolation Panel [NHANES]    Frequency of Communication with Friends and Family: More than three times a week    Frequency of Social Gatherings with Friends and Family: Three times a  week    Attends Religious Services: More than 4 times per year    Active Member of Clubs or Organizations: Patient declined    Attends Banker Meetings: Not on file    Marital Status: Patient declined   Family History  Problem Relation Age of Onset   Diabetes Brother    Heart disease Brother    Diabetes Brother    Heart disease Brother    Outpatient Encounter Medications as of 12/05/2023  Medication Sig   carboxymethylcellulose 1 % ophthalmic solution 1 drop 3 (three) times daily.   Cholecalciferol (VITAMIN D) 50 MCG (2000 UT) CAPS Take by mouth. Take one capsule daily   denosumab (PROLIA) 60 MG/ML SOSY injection Inject 60 mg into the skin every 6 (six) months.   Fish Oil-Cholecalciferol (FISH OIL + D3) 1000-1000 MG-UNIT CAPS Take 1 capsule by mouth daily.    HYDROcodone-acetaminophen (NORCO/VICODIN) 5-325 MG tablet One tablet every four hours for pain.   Iron, Ferrous Sulfate, 325 (65 Fe) MG TABS Take 325 mg by  mouth daily.   Multiple Vitamins-Minerals (CENTRAL-VITE FOR SENIORS PO) Take 1 tablet by mouth daily.    tiZANidine (ZANAFLEX) 4 MG tablet Take 1 tablet (4 mg total) by mouth every 6 (six) hours as needed for muscle spasms.   [DISCONTINUED] gabapentin (NEURONTIN) 100 MG capsule Take 1 capsule (100 mg total) by mouth at bedtime. (Patient not taking: Reported on 11/29/2023)   No facility-administered encounter medications on file as of 12/05/2023.   ALLERGIES: Allergies  Allergen Reactions   Penicillins     VACCINATION STATUS:  There is no immunization history on file for this patient.  HPI Natasha Willis is 83 y.o. female who presents today with a medical history as above. she is being seen in follow-up after she was seen in consultation for osteoporosis requested by Natasha Laroche, FNP.  She is accompanied by her daughter to clinic.  History is obtained from the family as well as chart review. See notes from her previous visit. Her medical history significant for chronic lumbar pain, compression fracture of L2 lumbar vertebra, T12 as well as L1. She is a survivor of childhood polio.  She is wheelchair-bound due to disequilibrium as well as kyphoscoliosis. Her bone density from November 2022  showed -3.4 T-score on right femur -2.7 on right forearm. Lumbar spine was not included due to degenerative joint disease.   She is on opioids for pain control as well as vitamin D treatment for vitamin D deficiency.  During her last visit, she was offered treatment with Prolia-status post 2 treatments already tolerating reasonably well. She denies any exposure to  high-dose steroids.  She denies family history of osteoporosis.  Her medical history also includes prediabetes, subclinical hypothyroidism, not on treatment.  She denies any history of parathyroid dysfunction.  No history of CKD nor liver disease. Review of Systems  Constitutional: + Mildly fluctuating body weight,  no fatigue, no  subjective hyperthermia, no subjective hypothermia Eyes: no blurry vision, no xerophthalmia   Objective:       12/05/2023    1:47 PM 11/29/2023    9:49 AM 11/20/2023    9:43 AM  Vitals with BMI  Height  5\' 1"  5\' 1"   Weight  146 lbs 6 oz 148 lbs 3 oz  BMI  27.68 28.02  Systolic 136 138 621  Diastolic 82 76 66  Pulse 80 85 84    BP 136/82   Pulse 80   Wt Readings from  Last 3 Encounters:  11/29/23 146 lb 6.4 oz (66.4 kg)  11/20/23 148 lb 3.2 oz (67.2 kg)  07/30/23 153 lb 6.4 oz (69.6 kg)    Physical Exam  Constitutional: + Wheelchair-bound, not in acute distress, normal state of mind  Musculoskeletal: + Significant kyphoscoliosis of thoracolumbar spine,  strength intact in all four extremities, no peripheral edema, wheelchair-bound at baseline   CMP ( most recent) CMP     Component Value Date/Time   NA 136 11/29/2023 1022   K 5.0 11/29/2023 1022   CL 101 11/29/2023 1022   CO2 21 11/29/2023 1022   GLUCOSE 119 (H) 11/29/2023 1022   GLUCOSE 102 (H) 06/30/2020 0922   BUN 15 11/29/2023 1022   CREATININE 0.59 11/29/2023 1022   CREATININE 0.48 (L) 06/30/2020 0922   CALCIUM 9.9 11/29/2023 1022   PROT 7.1 11/29/2023 1022   ALBUMIN 3.6 (L) 11/29/2023 1022   AST 64 (H) 11/29/2023 1022   ALT 7 11/29/2023 1022   ALKPHOS 105 11/29/2023 1022   BILITOT 0.6 11/29/2023 1022   EGFR 90 11/29/2023 1022   GFRNONAA 84 10/26/2020 0906   GFRNONAA 94 06/30/2020 0922     Diabetic Labs (most recent): Lab Results  Component Value Date   HGBA1C 5.2 11/29/2023   HGBA1C 5.7 (H) 07/30/2023   HGBA1C 5.8 (H) 04/22/2023     Lipid Panel ( most recent) Lipid Panel     Component Value Date/Time   CHOL 132 11/29/2023 1021   TRIG 72 11/29/2023 1021   HDL 48 11/29/2023 1021   CHOLHDL 2.8 11/29/2023 1021   CHOLHDL 3.5 12/28/2019 0838   LDLCALC 69 11/29/2023 1021   LDLCALC 107 (H) 12/28/2019 0838   LABVLDL 15 11/29/2023 1021      Lab Results  Component Value Date   TSH 2.770  11/29/2023   TSH 2.790 11/29/2023   TSH 7.890 (H) 07/30/2023   TSH 4.850 (H) 04/22/2023   TSH 7.520 (H) 12/07/2022   FREET4 0.98 11/29/2023   FREET4 1.01 11/29/2023   FREET4 1.04 07/30/2023   FREET4 1.05 04/22/2023   FREET4 1.10 12/07/2022    DualFemur Total Right 11/26/2023 81.6 Osteoporosis -4.1 0.493 g/cm2 -14.3% Yes DualFemur Total Right 07/18/2021 79.3 Osteoporosis -3.4 0.575 g/cm2 - -   DualFemur Total Mean 11/26/2023 81.6 Osteoporosis -4.0 0.507 g/cm2 -16.5% Yes DualFemur Total Mean 07/18/2021 79.3 Osteoporosis -3.2 0.607 g/cm2 - -   Right Forearm Radius 33% 11/26/2023 81.6 Osteoporosis -4.2 0.414 g/cm2 -20.8% Yes Right Forearm Radius 33% 07/18/2021 79.3 Osteoporosis -2.7 0.523 g/cm2 - -   ASSESSMENT: The BMD measured at Forearm Radius 33% is 0.414 g/cm2 with a T-score of -4.2. This patient's diagnostic category is OSTEOPOROSIS according to World Health Organization [facility]) criteria. The scan quality is good.   Comparison to 07/18/2021. Since the prior study, there has been a SIGNIFICANT DECREASE in bone mineral density of the RIGHT forearm (-20.8%) and bilateral hips total mean (-18.6%).  Assessment & Plan:   1. 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 - Based on these reviews, she has severe osteoporosis of hips and forearm.  Her osteoporosis is complicated by multiple compression fractures of the spine.  I reviewed her most recent bone density from November 2022 as well as the most recent one from March 2025 with the family.  Her most recent study shows T-score is  low at -4.1  right dualfemur, mean T-score of -4.0 dual femur, -4.2 on right forearm. She is status post 2 treatments with Prolia as recently as November 20, 2023 for her second dose, I discussed the fact her risk for future fractures is  high and needs to be continued with Prolia treatment and she agrees.   The family is made aware of the fact that his treatment is to continue without interruption.    -Her calcium/vitamin D status is acceptable.   -Fall precautions discussed with her.  Her next labs will include - Comprehensive metabolic panel She is advised to increase her protein intake and her nutrition.   - she is advised to maintain close follow up with Natasha Laroche, FNP for primary care needs.   I spent  22  minutes in the care of the patient today including review of labs from Thyroid Function, CMP, and other relevant labs ; imaging/biopsy records (current and previous including abstractions from other facilities); face-to-face time discussing  her lab results and symptoms, medications doses, her options of short and long term treatment based on the latest standards of care / guidelines;   and documenting the encounter.  Natasha Willis  participated in the discussions, expressed understanding, and voiced agreement with the above plans.  All questions were answered to her satisfaction. she is encouraged to contact clinic should she have any questions or concerns prior to her return visit.  Follow up plan: Return in about 6 months (around 05/22/2024) for Prolia During NV, F/U with Pre-visit Labs.   Marquis Lunch, MD [facility] [address] [place], [place] [address] Phone: [phone]  Fax: [phone]     12/05/2023, 4:51 PM  This note was partially dictated with voice recognition software. Similar sounding words can be transcribed inadequately or may not  be corrected upon review.

## 2024-01-08 ENCOUNTER — Encounter: Payer: Self-pay | Admitting: Orthopaedic Surgery

## 2024-01-08 ENCOUNTER — Ambulatory Visit: Payer: Medicare HMO | Admitting: Orthopaedic Surgery

## 2024-01-08 VITALS — BP 112/62 | HR 98 | Ht 61.0 in | Wt 146.0 lb

## 2024-01-08 DIAGNOSIS — M545 Low back pain, unspecified: Secondary | ICD-10-CM | POA: Diagnosis not present

## 2024-01-08 DIAGNOSIS — M8448XD Pathological fracture, other site, subsequent encounter for fracture with routine healing: Secondary | ICD-10-CM

## 2024-01-08 DIAGNOSIS — M8000XD Age-related osteoporosis with current pathological fracture, unspecified site, subsequent encounter for fracture with routine healing: Secondary | ICD-10-CM

## 2024-01-08 MED ORDER — HYDROCODONE-ACETAMINOPHEN 5-325 MG PO TABS: ORAL_TABLET | ORAL | 0 refills | Status: AC

## 2024-01-08 NOTE — Progress Notes (Signed)
 My back is sore.  She went to the osteoporosis clinic in [place].  She is doing chair exercises.  Her back is sore but she has no new trauma.  She has more pain with the change in weather.  Lower back is tender but no area of acute pain.  NV intact.  Muscle tone and strength are normal.  Encounter Diagnoses  Name Primary?   Age-related osteoporosis with current pathological fracture with routine healing, subsequent encounter Yes   Sacral insufficiency fracture with routine healing, subsequent encounter    Lumbar pain    I have reviewed the [place]  Controlled Substance Reporting System web site prior to prescribing narcotic medicine for this patient.  Return in three months.  Continue exercises.  Call if any problem.  Precautions discussed.  Electronically Signed Pleasant Brilliant, MD 4/23/20259:30 AM

## 2024-01-14 ENCOUNTER — Ambulatory Visit: Admitting: Obstetrics & Gynecology

## 2024-01-14 ENCOUNTER — Encounter: Payer: Self-pay | Admitting: Obstetrics & Gynecology

## 2024-01-14 VITALS — BP 121/70 | HR 106

## 2024-01-14 DIAGNOSIS — N6320 Unspecified lump in the left breast, unspecified quadrant: Secondary | ICD-10-CM | POA: Diagnosis not present

## 2024-01-14 NOTE — Progress Notes (Signed)
 Chief Complaint  Patient presents with   Breast Problem    Left breast lump and its bleeding      83 y.o. G5P0 No LMP recorded. Patient has had a hysterectomy. The current method of family planning is status post hysterectomy.  Outpatient Encounter Medications as of 01/14/2024  Medication Sig   carboxymethylcellulose 1 % ophthalmic solution 1 drop 3 (three) times daily.   Cholecalciferol (VITAMIN D ) 50 MCG (2000 UT) CAPS Take by mouth. Take one capsule daily   denosumab  (PROLIA ) 60 MG/ML SOSY injection Inject 60 mg into the skin every 6 (six) months.   Fish Oil-Cholecalciferol (FISH OIL + D3) 1000-1000 MG-UNIT CAPS Take 1 capsule by mouth daily.    HYDROcodone -acetaminophen  (NORCO/VICODIN) 5-325 MG tablet One tablet every four hours for pain.   Iron , Ferrous Sulfate , 325 (65 Fe) MG TABS Take 325 mg by mouth daily.   Multiple Vitamins-Minerals (CENTRAL-VITE FOR SENIORS PO) Take 1 tablet by mouth daily.    tiZANidine  (ZANAFLEX ) 4 MG tablet Take 1 tablet (4 mg total) by mouth every 6 (six) hours as needed for muscle spasms.   No facility-administered encounter medications on file as of 01/14/2024.    Subjective Pt presents complaining of a bleeding breast mass She says it has been present for 6 months, her daughter says more like 4 years  Exam is significant for large multilobular hard fungating breast mass with significant skin erosion and necrosis and fixation to the chest wall(see photo below)  Past Medical History:  Diagnosis Date   Asthma    Diverticulitis    Hypertension    Thyroid  disease     Past Surgical History:  Procedure Laterality Date   ABDOMINAL HYSTERECTOMY     ANTERIOR AND POSTERIOR REPAIR     APPENDECTOMY     CATARACT EXTRACTION W/PHACO Right 08/19/2014   Procedure: CATARACT EXTRACTION PHACO AND INTRAOCULAR LENS PLACEMENT RIGHT EYE;  Surgeon: Anner Kill, MD;  Location: [facility];  Service: Ophthalmology;  Laterality: Right;  CDE:7.60   CATARACT  EXTRACTION W/PHACO Left 08/30/2014   Procedure: CATARACT EXTRACTION PHACO AND INTRAOCULAR LENS PLACEMENT (IOC);  Surgeon: Anner Kill, MD;  Location: [facility];  Service: Ophthalmology;  Laterality: Left;  CDE:5.49   CHOLECYSTECTOMY     TUBAL LIGATION      OB History     Gravida  5   Para      Term      Preterm      AB      Living  5      SAB      IAB      Ectopic      Multiple      Live Births              Allergies  Allergen Reactions   Penicillins     Social History   Socioeconomic History   Marital status: Legally Separated    Spouse name: Not on file   Number of children: 5   Years of education: Not on file   Highest education level: 10th grade  Occupational History   Not on file  Tobacco Use   Smoking status: Never   Smokeless tobacco: Never  Substance and Sexual Activity   Alcohol use: No   Drug use: No   Sexual activity: Not Currently    Birth control/protection: Surgical  Other Topics Concern   Not on file  Social History Narrative   Lives alone   5 children,  one lives 2 doors down.   [place], [employer], and 2 in [place]      Cat: Veryl Gottron      Enjoys: reading-bible, christian books      Diet: strict diet (avoid sugar)   Caffeine: tea daily   Water: 6-8 cups       Wears seat belt- does not driving, daughter drives her    Psychologist, sport and exercise at home   No weapons   Social Drivers of Health   Financial Resource Strain: Low Risk  (11/25/2023)   Overall Financial Resource Strain (CARDIA)    Difficulty of Paying Living Expenses: Not hard at all  Food Insecurity: No Food Insecurity (11/25/2023)   Hunger Vital Sign    Worried About Running Out of Food in the Last Year: Never true    Ran Out of Food in the Last Year: Never true  Transportation Needs: No Transportation Needs (11/25/2023)   PRAPARE - Administrator, Civil Service (Medical): No    Lack of Transportation (Non-Medical): No  Physical Activity: Insufficiently Active  (11/25/2023)   Exercise Vital Sign    Days of Exercise per Week: 4 days    Minutes of Exercise per Session: 20 min  Stress: No Stress Concern Present (11/25/2023)   [employer] of Occupational Health - Occupational Stress Questionnaire    Feeling of Stress : Not at all  Social Connections: Unknown (11/25/2023)   Social Connection and Isolation Panel [NHANES]    Frequency of Communication with Friends and Family: More than three times a week    Frequency of Social Gatherings with Friends and Family: Three times a week    Attends Religious Services: More than 4 times per year    Active Member of Clubs or Organizations: Patient declined    Attends Banker Meetings: Not on file    Marital Status: Patient declined    Family History  Problem Relation Age of Onset   Diabetes Brother    Heart disease Brother    Diabetes Brother    Heart disease Brother     Medications:       Current Outpatient Medications:    carboxymethylcellulose 1 % ophthalmic solution, 1 drop 3 (three) times daily., Disp: , Rfl:    Cholecalciferol (VITAMIN D ) 50 MCG (2000 UT) CAPS, Take by mouth. Take one capsule daily, Disp: , Rfl:    denosumab  (PROLIA ) 60 MG/ML SOSY injection, Inject 60 mg into the skin every 6 (six) months., Disp: 1 mL, Rfl: 0   Fish Oil-Cholecalciferol (FISH OIL + D3) 1000-1000 MG-UNIT CAPS, Take 1 capsule by mouth daily. , Disp: , Rfl:    HYDROcodone -acetaminophen  (NORCO/VICODIN) 5-325 MG tablet, One tablet every four hours for pain., Disp: 30 tablet, Rfl: 0   Iron , Ferrous Sulfate , 325 (65 Fe) MG TABS, Take 325 mg by mouth daily., Disp: 30 tablet, Rfl: 2   Multiple Vitamins-Minerals (CENTRAL-VITE FOR SENIORS PO), Take 1 tablet by mouth daily. , Disp: , Rfl:    tiZANidine  (ZANAFLEX ) 4 MG tablet, Take 1 tablet (4 mg total) by mouth every 6 (six) hours as needed for muscle spasms., Disp: 60 tablet, Rfl: 0  Objective Blood pressure 121/70, pulse (!) 106.  Large hard multilobular  fungating necrotic breast mass on left, fixed to chest wall  Pertinent ROS No burning with urination, frequency or urgency No nausea, vomiting or diarrhea Nor fever chills or other constitutional symptoms   Labs or studies     Impression + Management Plan: Diagnoses  this Encounter::   ICD-10-CM   1. Mass of left breast, carcinoma  N63.20    Dr Larrie Po office contacted and appt made for her 01/28/24 2:45 pm. pt does not want to do chemotherapy or radiation at this point        Medications prescribed during  this encounter: No orders of the defined types were placed in this encounter.   Labs or Scans Ordered during this encounter: No orders of the defined types were placed in this encounter.     Follow up Return if symptoms worsen or fail to improve, for seeing Dr Larrie Po for eval May 13, 25@2 :45 pm.

## 2024-01-28 ENCOUNTER — Ambulatory Visit: Admitting: General Surgery

## 2024-02-06 ENCOUNTER — Encounter: Payer: Self-pay | Admitting: General Surgery

## 2024-02-06 ENCOUNTER — Ambulatory Visit: Admitting: General Surgery

## 2024-02-06 VITALS — BP 141/80 | HR 91 | Temp 97.9°F | Resp 14 | Ht 61.0 in | Wt 152.0 lb

## 2024-02-06 DIAGNOSIS — C50012 Malignant neoplasm of nipple and areola, left female breast: Secondary | ICD-10-CM | POA: Diagnosis not present

## 2024-02-07 NOTE — Progress Notes (Signed)
 Natasha WNUK; [id number]; 25-May-1941   HPI Patient is an 83 year old white female who was referred to my care by Dr. Randolm Butte for evaluation and treatment of a left breast cancer.  Patient had felt a mass in her left breast and underwent a mammogram in October 2020 which revealed multiple masses within the left breast and 3 abnormal lymph nodes in the left axilla which were highly suspicious for malignancy.  The patient elected not to proceed with further evaluation.  She was recently seen by Dr.Eure of gynecology and the patient was noted to have full involvement of the left breast with tumor.  She has been very resistant to having this further evaluated but consented to have me take a look at her. Past Medical History:  Diagnosis Date   Asthma    Diverticulitis    Hypertension    Thyroid  disease     Past Surgical History:  Procedure Laterality Date   ABDOMINAL HYSTERECTOMY     ANTERIOR AND POSTERIOR REPAIR     APPENDECTOMY     CATARACT EXTRACTION W/PHACO Right 08/19/2014   Procedure: CATARACT EXTRACTION PHACO AND INTRAOCULAR LENS PLACEMENT RIGHT EYE;  Surgeon: Anner Kill, MD;  Location: [facility];  Service: Ophthalmology;  Laterality: Right;  CDE:7.60   CATARACT EXTRACTION W/PHACO Left 08/30/2014   Procedure: CATARACT EXTRACTION PHACO AND INTRAOCULAR LENS PLACEMENT (IOC);  Surgeon: Anner Kill, MD;  Location: [facility];  Service: Ophthalmology;  Laterality: Left;  CDE:5.49   CHOLECYSTECTOMY     TUBAL LIGATION      Family History  Problem Relation Age of Onset   Diabetes Brother    Heart disease Brother    Diabetes Brother    Heart disease Brother     Current Outpatient Medications on File Prior to Visit  Medication Sig Dispense Refill   carboxymethylcellulose 1 % ophthalmic solution 1 drop 3 (three) times daily.     Cholecalciferol (VITAMIN D ) 50 MCG (2000 UT) CAPS Take by mouth. Take one capsule daily     denosumab  (PROLIA ) 60 MG/ML SOSY injection Inject 60 mg into the skin every 6  (six) months. 1 mL 0   Fish Oil-Cholecalciferol (FISH OIL + D3) 1000-1000 MG-UNIT CAPS Take 1 capsule by mouth daily.      HYDROcodone -acetaminophen  (NORCO/VICODIN) 5-325 MG tablet One tablet every four hours for pain. 30 tablet 0   Iron , Ferrous Sulfate , 325 (65 Fe) MG TABS Take 325 mg by mouth daily. 30 tablet 2   Multiple Vitamins-Minerals (CENTRAL-VITE FOR SENIORS PO) Take 1 tablet by mouth daily.      tiZANidine  (ZANAFLEX ) 4 MG tablet Take 1 tablet (4 mg total) by mouth every 6 (six) hours as needed for muscle spasms. 60 tablet 0   No current facility-administered medications on file prior to visit.    Allergies  Allergen Reactions   Penicillins     Social History   Substance and Sexual Activity  Alcohol Use No    Social History   Tobacco Use  Smoking Status Never  Smokeless Tobacco Never    Review of Systems  Constitutional: Negative.   HENT: Negative.    Eyes: Negative.   Respiratory: Negative.    Cardiovascular: Negative.   Gastrointestinal: Negative.   Genitourinary: Negative.   Musculoskeletal:  Positive for back pain and joint pain.  Skin: Negative.   Endo/Heme/Allergies: Negative.   Psychiatric/Behavioral: Negative.      Objective   Vitals:   02/06/24 1030  BP: (!) 141/80  Pulse: 91  Resp: 14  Temp: 97.9 F (36.6 C)  SpO2: 95%    Physical Exam Vitals reviewed.  Constitutional:      Appearance: Normal appearance. She is normal weight. She is not ill-appearing.  HENT:     Head: Normocephalic and atraumatic.  Cardiovascular:     Rate and Rhythm: Normal rate and regular rhythm.     Heart sounds: Normal heart sounds. No murmur heard.    No friction rub. No gallop.  Pulmonary:     Effort: Pulmonary effort is normal. No respiratory distress.     Breath sounds: Normal breath sounds. No stridor. No wheezing, rhonchi or rales.  Abdominal:     General: Bowel sounds are normal. There is no distension.     Palpations: Abdomen is soft. There is no  mass.     Tenderness: There is no abdominal tenderness. There is no guarding or rebound.     Hernia: No hernia is present.  Musculoskeletal:     Comments: Was in a wheelchair.  Skin:    General: Skin is warm and dry.  Neurological:     Mental Status: She is alert and oriented to person, place, and time.   Breast: No dominant mass, nipple discharge, or dimpling noted in right breast.  Left breast is severely deformed with peau d'orange and a large mass encompassing the whole breast.  Multiple palpable lymph nodes are noted in the left axilla.   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  Patient has been reluctant to undergo further treatment, though I did have an extensive discussion with her and told her she should see oncology just to hear what they have to say.  Should she decide to want to pursue possible hormone therapy, a left breast biopsy would be warranted.  She has been told that she cannot be cured of this cancer as it is very extensive. Plan  Patient has been referred to Dr. Katragadda for further evaluation and treatment.

## 2024-02-17 ENCOUNTER — Inpatient Hospital Stay

## 2024-02-17 ENCOUNTER — Inpatient Hospital Stay: Attending: Oncology | Admitting: Oncology

## 2024-02-17 ENCOUNTER — Encounter: Payer: Self-pay | Admitting: Oncology

## 2024-02-17 VITALS — BP 139/89 | HR 95 | Temp 97.7°F | Resp 20 | Ht 58.86 in | Wt 148.9 lb

## 2024-02-17 DIAGNOSIS — C50912 Malignant neoplasm of unspecified site of left female breast: Secondary | ICD-10-CM

## 2024-02-17 DIAGNOSIS — M810 Age-related osteoporosis without current pathological fracture: Secondary | ICD-10-CM | POA: Insufficient documentation

## 2024-02-17 NOTE — Progress Notes (Signed)
 Hematology-Oncology Clinic Note  Zarwolo, Gloria, FNP   Reason for Referral: Left breast carcinoma  Oncology History: I have reviewed her chart and materials related to her cancer extensively and collaborated history with the patient. Summary of oncologic history is as follows:  Oncology History  Breast carcinoma, female, left (HCC)  06/30/2019 Imaging   IMPRESSION:  1. Highly suspicious masses in the left breast at the 1 o'clock and 2 o'clock positions. 2.  Several morphologically abnormal lymph nodes in the left axilla. 3. Suspicious right breast mass at the 9 o'clock position with 2 indeterminate asymmetries medial to this mass.   02/17/2024 Initial Diagnosis   Breast carcinoma, female, left (HCC)       History of Presenting Illness: Natasha Willis 83 y.o. female is here for left breast carcinoma.  Patient is accompanied by her daughter today.  Patient has a past medical history of osteoporosis currently on Prolia  injections every 6 months and has back problems because of that.    Patient initially had a mammogram in 2020 that showed left breast concerning areas.  Patient refused biopsy at that time and never followed up.  Patient was seen by Dr. Randolm Butte for a regular GYN follow-up and she reported bleeding breast mass at that time.  She was then seen by Dr. Larrie Po who recommended a biopsy which she refused at that time.  Patient reported that initially when they found the concerning lesion she did not feel any lumps in her breast.  She started feeling lump 1 year ago and stated that recently it has started giving problems.  Patient reports bleeding from left breast but is not tender.  Denies fever, chills, abdominal pain.  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  And stated that her ophthalmologist told her that she cannot have chemotherapy or radiation therapy.  Patient reports that she has to drag her feet to walk because of her back problems but walks every  day in the patio.  She lives by herself and is very active around the house.  She takes care of herself and can fix meals but daughter helps with laundry and cleaning.  Patient does not smoke, does not drink alcohol.  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  Patient at this time is willing to do a biopsy and further workup for breast cancer.   Medical History: Past Medical History:  Diagnosis Date   Asthma    Diverticulitis    Hypertension    Thyroid  disease     Surgical history: Past Surgical History:  Procedure Laterality Date   ABDOMINAL HYSTERECTOMY     ANTERIOR AND POSTERIOR REPAIR     APPENDECTOMY     CATARACT EXTRACTION W/PHACO Right 08/19/2014   Procedure: CATARACT EXTRACTION PHACO AND INTRAOCULAR LENS PLACEMENT RIGHT EYE;  Surgeon: Anner Kill, MD;  Location: [facility];  Service: Ophthalmology;  Laterality: Right;  CDE:7.60   CATARACT EXTRACTION W/PHACO Left 08/30/2014   Procedure: CATARACT EXTRACTION PHACO AND INTRAOCULAR LENS PLACEMENT (IOC);  Surgeon: Anner Kill, MD;  Location: [facility];  Service: Ophthalmology;  Laterality: Left;  CDE:5.49   CHOLECYSTECTOMY     TUBAL LIGATION       Allergies:  is allergic to penicillins.  Medications:  Current Outpatient Medications  Medication Sig Dispense Refill   carboxymethylcellulose 1 % ophthalmic solution 1 drop 3 (three) times daily.     Cholecalciferol (VITAMIN D ) 50 MCG (2000 UT) CAPS Take by mouth. Take one capsule daily     denosumab  (  PROLIA ) 60 MG/ML SOSY injection Inject 60 mg into the skin every 6 (six) months. 1 mL 0   Fish Oil-Cholecalciferol (FISH OIL + D3) 1000-1000 MG-UNIT CAPS Take 1 capsule by mouth daily.      HYDROcodone -acetaminophen  (NORCO/VICODIN) 5-325 MG tablet One tablet every four hours for pain. 30 tablet 0   Iron , Ferrous Sulfate , 325 (65 Fe) MG TABS Take 325 mg by mouth daily. 30 tablet 2   Multiple Vitamins-Minerals (CENTRAL-VITE FOR SENIORS PO) Take 1 tablet by mouth daily.      tiZANidine  (ZANAFLEX )  4 MG tablet Take 1 tablet (4 mg total) by mouth every 6 (six) hours as needed for muscle spasms. 60 tablet 0   No current facility-administered medications for this visit.    Review of Systems: Constitutional: Denies fevers, chills or abnormal night sweats Eyes: Denies blurriness of vision, double vision or watery eyes Ears, nose, mouth, throat, and face: Denies mucositis or sore throat Respiratory: Denies cough, dyspnea or wheezes Cardiovascular: Denies palpitation, chest discomfort or lower extremity swelling Gastrointestinal:  Denies nausea, heartburn or change in bowel habits Skin: Denies abnormal skin rashes Lymphatics: Denies new lymphadenopathy or easy bruising Neurological:Denies numbness, tingling or new weaknesses Behavioral/Psych: Mood is stable, no new changes  All other systems were reviewed with the patient and are negative.  Physical Examination: ECOG PERFORMANCE STATUS: 2 - Symptomatic, <50% confined to bed  Vitals:   02/17/24 1057  BP: 139/89  Pulse: 95  Resp: 20  Temp: 97.7 F (36.5 C)  SpO2: 99%   Filed Weights   02/17/24 1057  Weight: 148 lb 14.4 oz (67.5 kg)    GENERAL:alert, no distress and comfortable, in a wheelchair SKIN: skin color, texture, turgor are normal, no rashes or significant lesions BREAST: Fungating left breast mass encroaching the whole of breast and extending into the axilla.  There is some ulceration and bleeding from the mass-see the picture below.  The left axillary lymph nodes are enlarged and matted.  No palpable right breast mass or axillary lymphadenopathy LYMPH:  no palpable lymphadenopathy in the cervical and inguinal LUNGS: clear to auscultation and percussion with normal breathing effort HEART: regular rate & rhythm and no murmurs and no lower extremity edema ABDOMEN:abdomen soft, non-tender and normal bowel sounds Musculoskeletal:no cyanosis of digits and no clubbing  PSYCH: alert & oriented x 3 with fluent  speech       Laboratory Data: I have reviewed the data as listed Lab Results  Component Value Date   WBC 8.4 11/29/2023   HGB 12.0 11/29/2023   HCT 37.8 11/29/2023   MCV 89 11/29/2023   PLT 321 11/29/2023   Recent Labs    07/30/23 1051 11/29/23 1021 11/29/23 1022  NA 139 136 136  K 4.6 5.0 5.0  CL 103 101 101  CO2 23 20 21   GLUCOSE 103* 116* 119*  BUN 15 16 15   CREATININE 0.52* 0.60 0.59  CALCIUM 9.7 9.8 9.9  PROT 7.2 6.9 7.1  ALBUMIN 3.6* 3.6* 3.6*  AST 35 60* 64*  ALT 7 7 7   ALKPHOS 86 107 105  BILITOT 0.5 0.6 0.6    Radiographic Studies: I have personally reviewed the radiological images as listed and agreed with the findings in the report.  DG Bone Density EXAM: DUAL X-RAY ABSORPTIOMETRY (DXA) FOR BONE MINERAL DENSITY  IMPRESSION: Your patient Birdell Frasier completed a BMD test on 11/26/2023 using the [employer] DXA System (software version: 14.10) manufactured by [employer]. The following summarizes  the results of our evaluation. Technologist: [facility] PATIENT BIOGRAPHICAL: Name: Tatayana, Beshears Patient ID: [id number] Birth Date: 1941/08/28 Height: 61.0 in. Gender: Female Exam Date: 11/26/2023 Weight: 148.2 lbs. Indications: Advanced Age, Caucasian, Follow up Osteoporosis, Height Loss, Low Calcium Intake, Partial Hysterectomy, Post Menopausal Fractures: L-Spine, T-Spine Treatments: Prolia , Vitamin D  DENSITOMETRY RESULTS: Site          Region      Measured Date Measured Age WHO Classification Young Adult T-score BMD         %Change vs. Previous Significant Change (*) DualFemur Total Right 11/26/2023 81.6 Osteoporosis -4.1 0.493 g/cm2 -14.3% Yes DualFemur Total Right 07/18/2021 79.3 Osteoporosis -3.4 0.575 g/cm2 - -  DualFemur Total Mean 11/26/2023 81.6 Osteoporosis -4.0 0.507 g/cm2 -16.5% Yes DualFemur Total Mean 07/18/2021 79.3 Osteoporosis -3.2 0.607 g/cm2 - -  Right Forearm Radius 33% 11/26/2023 81.6 Osteoporosis -4.2  0.414 g/cm2 -20.8% Yes Right Forearm Radius 33% 07/18/2021 79.3 Osteoporosis -2.7 0.523 g/cm2 - -  ASSESSMENT: The BMD measured at Forearm Radius 33% is 0.414 g/cm2 with a T-score of -4.2. This patient's diagnostic category is OSTEOPOROSIS according to World Health Organization [facility]) criteria. The scan quality is good.  Comparison to 07/18/2021. Since the prior study, there has been a SIGNIFICANT DECREASE in bone mineral density of the RIGHT forearm (-20.8%) and bilateral hips total mean (-18.6%).  World Science writer [facility]) criteria for post-menopausal, Caucasian Women: Normal:       T-score at or above -1 SD Osteopenia:   T-score between -1 and -2.5 SD Osteoporosis: T-score at or below -2.5 SD  RECOMMENDATIONS: 1. All patients should optimize calcium and vitamin D  intake. 2. Consider FDA-approved medical therapies in postmenopausal women and med aged 70 years and older, based on the following: a. A hip or vertebral (clinical or morphometric) fracture b. T-score< -2.5 at the femoral neck or spine after appropriate evaluation to exclude secondary causes c. Low bone mass (T-score between -1.0 and -2.5 at the femoral neck or spine) and a 10-year probability of a hip fracture > 3% or a 10-year probability of a major osteoporosis-related fracture > 20% based on the [place] -adapted WHO algorithm d. Clinician judgment and/or patient preferences may indicate treatment for people with 10-year fracture probabilities above or below these levels FOLLOW-UP: Patients with diagnosis of osteoporosis or at high risk for fracture should have regular bone mineral density tests. For patients eligible for Medicare, routine testing is allowed once every 2 years. The testing frequency can be increased to one year for patients who have rapidly progressing disease, those who are receiving or discontinuing medical therapy to restore bone mass, or have additional risk factors.  I have reviewed this  report, and agree with the above findings.  [facility]  Electronically Signed   By: Sundra Engel M.D.   On: 11/26/2023 09:50    ASSESSMENT & PLAN:  Patient is an 83 year old female with locally advanced left breast cancer   Breast carcinoma, female, left (HCC) Patient with at least locally advanced left breast carcinoma.  Unknown stage and type at this time.  - Will obtain a PET scan to rule out metastasis and look for other feasible sites for biopsy - If there are no other spots of biopsy, will rerefer to Dr. Larrie Po for a breast biopsy -Will refer to radiation oncology for symptomatic control of breast bleeding. - Discussed that this looks like locally advanced cancer and would require a combination of chemotherapy/endocrine therapy with or without other agents like CDK 4/6 inhibitor -  Patient at this time is willing to proceed with further workup and management  Return to clinic 1 week after PET scan to discuss results and further management   Age-related osteoporosis without current pathological fracture Patient has a long-term history of ulcer process being managed by Dr. Monte Antonio  - Continue to follow with Dr. Monte Antonio and take Prolia  injections every 6 months - Should consider this while discussing endocrine therapy options if needed in future   Orders Placed This Encounter  Procedures   NM PET Image Initial (PI) Skull Base To Thigh    Standing Status:   Future    Expected Date:   02/17/2024    Expiration Date:   02/16/2025    If indicated for the ordered procedure, I authorize the administration of a radiopharmaceutical per Radiology protocol:   Yes    Preferred imaging location?:   [facility]   Cancer antigen 15-3    Standing Status:   Future    Number of Occurrences:   1    Expected Date:   02/17/2024    Expiration Date:   02/16/2025   Cancer antigen 27.29    Standing Status:   Future    Number of Occurrences:   1    Expected Date:   02/17/2024    Expiration  Date:   02/16/2025    The total time spent in the appointment was 60 minutes encounter with patients including review of chart and various tests results, discussions about plan of care and coordination of care plan   All questions were answered. The patient knows to call the clinic with any problems, questions or concerns. No barriers to learning was detected.  Eduardo Grade, MD 6/3/202510:43 AM

## 2024-02-17 NOTE — Patient Instructions (Signed)
 VISIT SUMMARY:  You came in today for an evaluation of your breast cancer. We discussed the progression of your symptoms, including the recent bleeding from your breast and the visible mass. We also reviewed your osteoporosis, sciatica, hypertension, and diabetes management.  YOUR PLAN:  -BREAST CANCER: Breast cancer is a type of cancer that forms in the cells of the breasts. Your cancer appears to have progressed, and we need to perform further imaging and a biopsy to confirm the type and status of the cancer. We will order a PET scan to check for any spread of the cancer and determine the best site for the biopsy. After the biopsy, we will discuss the possibility of radiation therapy to control the bleeding and consider endocrine therapy, keeping in mind your osteoporosis risk.  -OSTEOPOROSIS: Osteoporosis is a condition where bones become weak and brittle. You are currently receiving Prolia  injections every six months to help manage this condition. We will need to be cautious with any endocrine therapy for your breast cancer, as it may worsen your osteoporosis.   INSTRUCTIONS:  1. Schedule a PET scan to assess for metastasis and determine the biopsy site. 2. Follow up with radiation oncology to discuss radiation therapy for bleeding control. 3. Coordinate with Dr. Larrie Po regarding the biopsy and treatment plan. 4. Continue monitoring your blood pressure and blood sugar levels regularly.

## 2024-02-18 DIAGNOSIS — M810 Age-related osteoporosis without current pathological fracture: Secondary | ICD-10-CM | POA: Insufficient documentation

## 2024-02-18 LAB — CANCER ANTIGEN 15-3: CA 15-3: 132 U/mL — ABNORMAL HIGH (ref 0.0–25.0)

## 2024-02-18 LAB — CANCER ANTIGEN 27.29: CA 27.29: 167.7 U/mL — ABNORMAL HIGH (ref 0.0–38.6)

## 2024-02-18 NOTE — Assessment & Plan Note (Signed)
 Patient has a long-term history of ulcer process being managed by Dr. Monte Antonio  - Continue to follow with Dr. Monte Antonio and take Prolia  injections every 6 months - Should consider this while discussing endocrine therapy options if needed in future

## 2024-02-18 NOTE — Assessment & Plan Note (Signed)
 Patient with at least locally advanced left breast carcinoma.  Unknown stage and type at this time.  - Will obtain a PET scan to rule out metastasis and look for other feasible sites for biopsy - If there are no other spots of biopsy, will rerefer to Dr. Larrie Po for a breast biopsy -Will refer to radiation oncology for symptomatic control of breast bleeding. - Discussed that this looks like locally advanced cancer and would require a combination of chemotherapy/endocrine therapy with or without other agents like CDK 4/6 inhibitor - Patient at this time is willing to proceed with further workup and management  Return to clinic 1 week after PET scan to discuss results and further management

## 2024-02-27 ENCOUNTER — Ambulatory Visit (HOSPITAL_COMMUNITY)
Admission: RE | Admit: 2024-02-27 | Discharge: 2024-02-27 | Disposition: A | Discharge status: Home / Self Care | Source: Ambulatory Visit | Attending: Oncology | Admitting: Oncology

## 2024-02-27 DIAGNOSIS — C50912 Malignant neoplasm of unspecified site of left female breast: Secondary | ICD-10-CM | POA: Diagnosis not present

## 2024-02-27 MED ORDER — FLUDEOXYGLUCOSE F - 18 (FDG) INJECTION
7.8300 | Freq: Once | INTRAVENOUS | Status: AC | PRN
  Administered 2024-02-27: 7.83 via INTRAVENOUS

## 2024-03-03 DIAGNOSIS — C7801 Secondary malignant neoplasm of right lung: Secondary | ICD-10-CM | POA: Diagnosis not present

## 2024-03-03 DIAGNOSIS — C7802 Secondary malignant neoplasm of left lung: Secondary | ICD-10-CM | POA: Diagnosis not present

## 2024-03-03 DIAGNOSIS — Z510 Encounter for antineoplastic radiation therapy: Secondary | ICD-10-CM | POA: Diagnosis not present

## 2024-03-03 DIAGNOSIS — L539 Erythematous condition, unspecified: Secondary | ICD-10-CM | POA: Diagnosis not present

## 2024-03-03 DIAGNOSIS — M40209 Unspecified kyphosis, site unspecified: Secondary | ICD-10-CM | POA: Diagnosis not present

## 2024-03-03 DIAGNOSIS — I10 Essential (primary) hypertension: Secondary | ICD-10-CM | POA: Diagnosis not present

## 2024-03-03 DIAGNOSIS — C50812 Malignant neoplasm of overlapping sites of left female breast: Secondary | ICD-10-CM | POA: Diagnosis not present

## 2024-03-03 DIAGNOSIS — L98499 Non-pressure chronic ulcer of skin of other sites with unspecified severity: Secondary | ICD-10-CM | POA: Diagnosis not present

## 2024-03-05 ENCOUNTER — Inpatient Hospital Stay: Admitting: Oncology

## 2024-03-05 VITALS — BP 133/66 | HR 78 | Temp 98.1°F | Resp 18 | Ht <= 58 in | Wt 146.0 lb

## 2024-03-05 DIAGNOSIS — M810 Age-related osteoporosis without current pathological fracture: Secondary | ICD-10-CM | POA: Diagnosis not present

## 2024-03-05 DIAGNOSIS — C50912 Malignant neoplasm of unspecified site of left female breast: Secondary | ICD-10-CM | POA: Diagnosis not present

## 2024-03-05 NOTE — Assessment & Plan Note (Signed)
 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 Patient does not wish to get any systemic therapy at this point and just wants to get palliative radiation.  She does not want to get biopsy to confirm the diagnosis and type of the disease.  - Continue with radiation oncology for palliative radiation - Patient wishes to follow-up for further care and palliative needs. -Discussed with patient that not giving systemic therapy can result in progression of disease and ultimately death.  Patient understands the risks and chooses to have better quality of life over quantity with systemic therapy  Return to clinic in 2 months

## 2024-03-05 NOTE — Progress Notes (Signed)
 Patient Care Team: Zarwolo, Gloria, FNP as PCP - General (Family Medicine)  Clinic Day:  03/05/2024  Referring physician: Zarwolo, Gloria, FNP   CHIEF COMPLAINT:  CC: Breast carcinoma   ASSESSMENT & PLAN:   Assessment & Plan: Natasha Willis  is a 83 y.o. 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 Patient does not wish to get any systemic therapy at this point and just wants to get palliative radiation.  She does not want to get biopsy to confirm the diagnosis and type of the disease.  - Continue with radiation oncology for palliative radiation - Patient wishes to follow-up for further care and palliative needs. -Discussed with patient that not giving systemic therapy can result in progression of disease and ultimately death.  Patient understands the risks and chooses to have better quality of life over quantity with systemic therapy  Return to clinic in 2 months    The patient understands the plans discussed today and is in agreement with them.  She knows to contact our office if she develops concerns prior to her next appointment.  I provided 40 minutes of face-to-face time during this encounter and > 50% was spent counseling as documented under my assessment and plan.    Eduardo Grade, MD  [facility] [place] - A DEPT OF [facility] [address] [place] [address] Dept: [phone] Dept Fax: [phone]   No orders of the defined types were placed in this encounter.    ONCOLOGY HISTORY:   Oncology History  Breast carcinoma, female, left (HCC)  06/30/2019 Imaging   IMPRESSION:  1. Highly suspicious masses in the left breast at the 1 o'clock and 2 o'clock  positions. 2.  Several morphologically abnormal lymph nodes in the left axilla. 3. Suspicious right breast mass at the 9 o'clock position with 2 indeterminate asymmetries medial to this mass.   02/17/2024 Initial Diagnosis   Breast carcinoma, female, left (HCC)   02/27/2024 PET scan   IMPRESSION: Very large invasive multilobular left breast mass identified with extension along the pectoralis major muscle.   Nodular areas of increased uptake along the lateral aspect of the right breast. A separate right-sided breast lesion is possible.   Multiple bilateral lung nodules. There are proximally 8 which show some level of abnormal uptake and worrisome for multifocal lung metastases.   Additional hypermetabolic soft tissue mass along the left chest wall immediately anterior to the sternum the junction to the left first rib.   Focus of abnormal uptake along the left eighth rib posteriorly, possible bony metastatic focus.   Large hiatal hernia. The entirety of the stomach is in the mediastinum. Colonic diverticula. Significant motion.    Tumor Marker   Patient's tumor was tested for the following markers:  CA 15-3: 132 CA 27.29: 167.7   03/05/2024 Cancer Staging   Staging form: Breast, AJCC 8th Edition - Pathologic stage from 03/05/2024: Stage IV (cT4b, cN2, pM1, GX, ER: Unknown, PR: Unknown, HER2: Unknown) - Signed by Eduardo Grade, MD on 03/05/2024 Stage prefix: Initial diagnosis Method of lymph node assessment: Clinical Nuclear grade: GX Multigene prognostic tests performed: None Histologic grading system: 3 grade system Laterality: Left       Current Treatment: Palliative radiation to the  breast  INTERVAL HISTORY:  [name] is here today for follow up. Patient is accompanied by her daughter today.  Patient reports some tingling sensations in her legs but no pain at this time.  Her breast bleeding has decreased compared to prior visit.  She has no other complaints at this  time.  We discussed that the PET scan findings are consistent with metastatic disease and treatment at this time would involve palliative systemic therapy along with palliative radiation to the breast.  Patient does not want any systemic therapy at this time and would also not do a biopsy to confirm the diagnosis.  She is planned to start palliative radiation to the breast soon.  We discussed risks of not getting systemic therapy and patient refers to choose quality of life and comfort measures over systemic therapy at this time.  Emotional support provided during the visit.  I have reviewed the past medical history, past surgical history, social history and family history with the patient and they are unchanged from previous note.  ALLERGIES:  is allergic to penicillins.  MEDICATIONS:  Current Outpatient Medications  Medication Sig Dispense Refill   carboxymethylcellulose 1 % ophthalmic solution 1 drop 3 (three) times daily.     Cholecalciferol (VITAMIN D ) 50 MCG (2000 UT) CAPS Take by mouth. Take one capsule daily     denosumab  (PROLIA ) 60 MG/ML SOSY injection Inject 60 mg into the skin every 6 (six) months. 1 mL 0   Fish Oil-Cholecalciferol (FISH OIL + D3) 1000-1000 MG-UNIT CAPS Take 1 capsule by mouth daily.      HYDROcodone -acetaminophen  (NORCO/VICODIN) 5-325 MG tablet One tablet every four hours for pain. 30 tablet 0   Iron , Ferrous Sulfate , 325 (65 Fe) MG TABS Take 325 mg by mouth daily. 30 tablet 2   Multiple Vitamins-Minerals (CENTRAL-VITE FOR SENIORS PO) Take 1 tablet by mouth daily.      tiZANidine  (ZANAFLEX ) 4 MG tablet Take 1 tablet (4 mg total) by mouth every 6 (six) hours as needed for muscle spasms. 60 tablet 0   No current facility-administered medications for this visit.     REVIEW OF SYSTEMS:   Constitutional: Denies fevers, chills or abnormal weight loss Eyes: Denies blurriness of vision Ears, nose, mouth, throat, and face: Denies mucositis or sore throat Respiratory:  Denies cough, dyspnea or wheezes Cardiovascular: Denies palpitation, chest discomfort or lower extremity swelling Gastrointestinal:  Denies nausea, heartburn or change in bowel habits Skin: Denies abnormal skin rashes Lymphatics: Denies new lymphadenopathy or easy bruising Neurological:Denies numbness, tingling or new weaknesses Behavioral/Psych: Mood is stable, no new changes  All other systems were reviewed with the patient and are negative.   VITALS:  Blood pressure 133/66, pulse 78, temperature 98.1 F (36.7 C), temperature source Tympanic, resp. rate 18, height 4' 10 (1.473 m), weight 146 lb (66.2 kg), SpO2 97%.  Wt Readings from Last 3 Encounters:  03/05/24 146 lb (66.2 kg)  02/17/24 148 lb 14.4 oz (67.5 kg)  02/06/24 152 lb (68.9 kg)    Body mass index is 30.51 kg/m.  Performance status (ECOG): 3 - Symptomatic, >50% confined to bed  PHYSICAL EXAM:   GENERAL:alert, no distress and comfortable, in a wheelchair SKIN: skin color, texture, turgor are normal, no rashes or significant lesions BREAST: Fungating left breast mass encroaching the whole of breast and extending into the axilla.  There is some ulceration and bleeding from the mass-see the picture below.  The left axillary lymph nodes are enlarged and matted.  No palpable right breast mass or axillary lymphadenopathy LYMPH:  no palpable lymphadenopathy in the cervical and inguinal LUNGS: clear to auscultation and percussion with normal breathing effort HEART: regular rate & rhythm and no murmurs and no lower extremity edema ABDOMEN:abdomen soft, non-tender and normal bowel sounds Musculoskeletal:no cyanosis of digits and no clubbing  PSYCH: alert & oriented x 3 with fluent speech  LABORATORY DATA:  I have reviewed the data as listed    Component Value Date/Time   NA 136 11/29/2023 1022   K 5.0 11/29/2023 1022   CL 101 11/29/2023 1022   CO2 21 11/29/2023 1022   GLUCOSE 119 (H) 11/29/2023 1022   GLUCOSE 102 (H)  06/30/2020 0922   BUN 15 11/29/2023 1022   CREATININE 0.59 11/29/2023 1022   CREATININE 0.48 (L) 06/30/2020 0922   CALCIUM 9.9 11/29/2023 1022   PROT 7.1 11/29/2023 1022   ALBUMIN 3.6 (L) 11/29/2023 1022   AST 64 (H) 11/29/2023 1022   ALT 7 11/29/2023 1022   ALKPHOS 105 11/29/2023 1022   BILITOT 0.6 11/29/2023 1022   GFRNONAA 84 10/26/2020 0906   GFRNONAA 94 06/30/2020 0922   GFRAA 96 10/26/2020 0906   GFRAA 109 06/30/2020 0922    Lab Results  Component Value Date   WBC 8.4 11/29/2023   NEUTROABS 6.1 11/29/2023   HGB 12.0 11/29/2023   HCT 37.8 11/29/2023   MCV 89 11/29/2023   PLT 321 11/29/2023      Chemistry      Component Value Date/Time   NA 136 11/29/2023 1022   K 5.0 11/29/2023 1022   CL 101 11/29/2023 1022   CO2 21 11/29/2023 1022   BUN 15 11/29/2023 1022   CREATININE 0.59 11/29/2023 1022   CREATININE 0.48 (L) 06/30/2020 0922      Component Value Date/Time   CALCIUM 9.9 11/29/2023 1022   ALKPHOS 105 11/29/2023 1022   AST 64 (H) 11/29/2023 1022   ALT 7 11/29/2023 1022   BILITOT 0.6 11/29/2023 1022      Latest Reference Range & Units 02/17/24 11:43  CA 15-3 0.0 - 25.0 U/mL 132.0 (H)  CA 27.29 0.0 - 38.6 U/mL 167.7 (H)  (H): Data is abnormally high  RADIOGRAPHIC STUDIES: I have personally reviewed the radiological images as listed and agreed with the findings in the report.  NM PET Image Initial (PI) Skull Base To Thigh Result Date: 03/02/2024 CLINICAL DATA:  Initial treatment strategy for breast cancer. EXAM: NUCLEAR MEDICINE PET SKULL BASE TO THIGH TECHNIQUE: 7.83 mCi F-18 FDG was injected intravenously. Full-ring PET imaging was performed from the skull base to thigh after the radiotracer. CT data was obtained and used for attenuation correction and anatomic localization. Fasting blood glucose: 132 mg/dl COMPARISON:  None Available. FINDINGS: Mediastinal blood pool activity: SUV max 2.3 Liver activity: SUV max 2.9 NECK: No specific abnormal uptake  identified in the neck including along lymph node change of the submandibular, posterior triangle or internal jugular region. Near symmetric uptake of the visualized intracranial compartment. Incidental CT findings: The parotid glands, submandibular glands and thyroid  gland are grossly preserved. There is opacification of the right maxillary sinus but no abnormal uptake. Please correlate with any symptoms of sinusitis. Mastoid air cells are clear. Hyperostosis frontalis interna. Scattered mild vascular calcifications are noted. CHEST: There is a large heterogeneous mass involving the left breast. This invades the pectoralis major muscle. This multilobular mass diffusely hypermetabolic. Dimension on image 63 of the CT scan of the mass in its entirety  would measure proximally 9.3 x 16.2 cm. Maximum SUV of 17.2. There is a separate chest wall mass identified which hypermetabolic immediately anterior to the sternum along the left side at the junction to the first rib. This area has maximum SUV 11.4 and on CT image 47 measures 2.2 by 1.1 cm. The There are some scattered hypermetabolic lung nodules identified. There are 4 in the right lung and 4 on the left. Largest and most hypermetabolic focus is along the anteromedial left upper lobe with maximum SUV of 12.0 and dimension on image 47 measuring 15 x 11 mm. The dominant focus in the right right lung is along the middle lobe abutting the mediastinum with maximum SUV a of 6.9 and dimension on image 63 measuring 13 x 7 mm. There are several other smaller nodules scattered in both lungs which are not hypermetabolic such as right upper lobe on image 47 measuring 5 mm. With the other findings these could be worrisome for additional areas of lung metastases but are quite small and may be below threshold for uptake. There are some nodular foci with abnormal uptake along the lateral margin of the right breast. Focus has maximum SUV value of 4.1 and on image 67 tissue in this  location measures up to 17 mm. This could be a separate right-sided breast lesion. Recommend further workup. No specific abnormal uptake seen above blood pool in the axillary regions, hilum or mediastinum. Mild uptake along the esophagus, nonspecific but there is a large hiatal hernia. Incidental CT findings: Significant motion. The heart is nonenlarged. Scattered vascular calcifications. No pleural effusion. ABDOMEN/PELVIS: Physiologic distribution radiotracer along the parenchymal organs, bowel and renal collecting systems. No abnormal nodal uptake. Incidental CT findings: Previous cholecystectomy. Breathing motion. Bosniak 1/2 left-sided renal cyst without uptake measuring 4.8 cm. Scattered vascular calcifications. Preserved contour to the urinary bladder. Diffuse colonic stool. Left-sided colonic diverticulosis. Small bowel is nondilated. Once again large hiatal hernia. SKELETON: There is a focus of increased uptake maximum SUV of 3.7 along the posterior aspect of the left eighth rib. A bony metastasis is possible. Incidental CT findings: Scattered degenerative changes. Curvature of the spine. Mild compression deformity of the L2 vertebral body. No abnormal uptake. This could be more chronic. Please correlate with history. IMPRESSION: Very large invasive multilobular left breast mass identified with extension along the pectoralis major muscle. Nodular areas of increased uptake along the lateral aspect of the right breast. A separate right-sided breast lesion is possible. Recommend further evaluation with mammography and ultrasound a if there is no known history. Multiple bilateral lung nodules. There are proximally 8 which show some level of abnormal uptake and worrisome for multifocal lung metastases. Additional hypermetabolic soft tissue mass along the left chest wall immediately anterior to the sternum the junction to the left first rib. Focus of abnormal uptake along the left eighth rib posteriorly, possible  bony metastatic focus. Large hiatal hernia. The entirety of the stomach is in the mediastinum. Colonic diverticula. Significant motion. Electronically Signed   By: Adrianna Horde M.D.   On: 03/02/2024 13:26

## 2024-03-06 DIAGNOSIS — Z510 Encounter for antineoplastic radiation therapy: Secondary | ICD-10-CM | POA: Diagnosis not present

## 2024-03-06 DIAGNOSIS — C7801 Secondary malignant neoplasm of right lung: Secondary | ICD-10-CM | POA: Diagnosis not present

## 2024-03-06 DIAGNOSIS — L98499 Non-pressure chronic ulcer of skin of other sites with unspecified severity: Secondary | ICD-10-CM | POA: Diagnosis not present

## 2024-03-06 DIAGNOSIS — C50812 Malignant neoplasm of overlapping sites of left female breast: Secondary | ICD-10-CM | POA: Diagnosis not present

## 2024-03-06 DIAGNOSIS — L539 Erythematous condition, unspecified: Secondary | ICD-10-CM | POA: Diagnosis not present

## 2024-03-06 DIAGNOSIS — I10 Essential (primary) hypertension: Secondary | ICD-10-CM | POA: Diagnosis not present

## 2024-03-06 DIAGNOSIS — M40209 Unspecified kyphosis, site unspecified: Secondary | ICD-10-CM | POA: Diagnosis not present

## 2024-03-06 DIAGNOSIS — C7802 Secondary malignant neoplasm of left lung: Secondary | ICD-10-CM | POA: Diagnosis not present

## 2024-03-09 DIAGNOSIS — I10 Essential (primary) hypertension: Secondary | ICD-10-CM | POA: Diagnosis not present

## 2024-03-09 DIAGNOSIS — L98499 Non-pressure chronic ulcer of skin of other sites with unspecified severity: Secondary | ICD-10-CM | POA: Diagnosis not present

## 2024-03-09 DIAGNOSIS — C7802 Secondary malignant neoplasm of left lung: Secondary | ICD-10-CM | POA: Diagnosis not present

## 2024-03-09 DIAGNOSIS — Z510 Encounter for antineoplastic radiation therapy: Secondary | ICD-10-CM | POA: Diagnosis not present

## 2024-03-09 DIAGNOSIS — M40209 Unspecified kyphosis, site unspecified: Secondary | ICD-10-CM | POA: Diagnosis not present

## 2024-03-09 DIAGNOSIS — L539 Erythematous condition, unspecified: Secondary | ICD-10-CM | POA: Diagnosis not present

## 2024-03-09 DIAGNOSIS — C7801 Secondary malignant neoplasm of right lung: Secondary | ICD-10-CM | POA: Diagnosis not present

## 2024-03-09 DIAGNOSIS — C50812 Malignant neoplasm of overlapping sites of left female breast: Secondary | ICD-10-CM | POA: Diagnosis not present

## 2024-03-10 DIAGNOSIS — C50812 Malignant neoplasm of overlapping sites of left female breast: Secondary | ICD-10-CM | POA: Diagnosis not present

## 2024-03-10 DIAGNOSIS — C7801 Secondary malignant neoplasm of right lung: Secondary | ICD-10-CM | POA: Diagnosis not present

## 2024-03-10 DIAGNOSIS — M40209 Unspecified kyphosis, site unspecified: Secondary | ICD-10-CM | POA: Diagnosis not present

## 2024-03-10 DIAGNOSIS — I10 Essential (primary) hypertension: Secondary | ICD-10-CM | POA: Diagnosis not present

## 2024-03-10 DIAGNOSIS — Z510 Encounter for antineoplastic radiation therapy: Secondary | ICD-10-CM | POA: Diagnosis not present

## 2024-03-10 DIAGNOSIS — C7802 Secondary malignant neoplasm of left lung: Secondary | ICD-10-CM | POA: Diagnosis not present

## 2024-03-10 DIAGNOSIS — L539 Erythematous condition, unspecified: Secondary | ICD-10-CM | POA: Diagnosis not present

## 2024-03-10 DIAGNOSIS — L98499 Non-pressure chronic ulcer of skin of other sites with unspecified severity: Secondary | ICD-10-CM | POA: Diagnosis not present

## 2024-03-11 DIAGNOSIS — L539 Erythematous condition, unspecified: Secondary | ICD-10-CM | POA: Diagnosis not present

## 2024-03-11 DIAGNOSIS — Z510 Encounter for antineoplastic radiation therapy: Secondary | ICD-10-CM | POA: Diagnosis not present

## 2024-03-11 DIAGNOSIS — M40209 Unspecified kyphosis, site unspecified: Secondary | ICD-10-CM | POA: Diagnosis not present

## 2024-03-11 DIAGNOSIS — L98499 Non-pressure chronic ulcer of skin of other sites with unspecified severity: Secondary | ICD-10-CM | POA: Diagnosis not present

## 2024-03-11 DIAGNOSIS — C7801 Secondary malignant neoplasm of right lung: Secondary | ICD-10-CM | POA: Diagnosis not present

## 2024-03-11 DIAGNOSIS — C7802 Secondary malignant neoplasm of left lung: Secondary | ICD-10-CM | POA: Diagnosis not present

## 2024-03-11 DIAGNOSIS — I10 Essential (primary) hypertension: Secondary | ICD-10-CM | POA: Diagnosis not present

## 2024-03-11 DIAGNOSIS — C50812 Malignant neoplasm of overlapping sites of left female breast: Secondary | ICD-10-CM | POA: Diagnosis not present

## 2024-03-12 DIAGNOSIS — C50812 Malignant neoplasm of overlapping sites of left female breast: Secondary | ICD-10-CM | POA: Diagnosis not present

## 2024-03-12 DIAGNOSIS — L539 Erythematous condition, unspecified: Secondary | ICD-10-CM | POA: Diagnosis not present

## 2024-03-12 DIAGNOSIS — C7801 Secondary malignant neoplasm of right lung: Secondary | ICD-10-CM | POA: Diagnosis not present

## 2024-03-12 DIAGNOSIS — L98499 Non-pressure chronic ulcer of skin of other sites with unspecified severity: Secondary | ICD-10-CM | POA: Diagnosis not present

## 2024-03-12 DIAGNOSIS — M40209 Unspecified kyphosis, site unspecified: Secondary | ICD-10-CM | POA: Diagnosis not present

## 2024-03-12 DIAGNOSIS — C7802 Secondary malignant neoplasm of left lung: Secondary | ICD-10-CM | POA: Diagnosis not present

## 2024-03-12 DIAGNOSIS — I10 Essential (primary) hypertension: Secondary | ICD-10-CM | POA: Diagnosis not present

## 2024-03-12 DIAGNOSIS — Z510 Encounter for antineoplastic radiation therapy: Secondary | ICD-10-CM | POA: Diagnosis not present

## 2024-03-13 DIAGNOSIS — M40209 Unspecified kyphosis, site unspecified: Secondary | ICD-10-CM | POA: Diagnosis not present

## 2024-03-13 DIAGNOSIS — Z510 Encounter for antineoplastic radiation therapy: Secondary | ICD-10-CM | POA: Diagnosis not present

## 2024-03-13 DIAGNOSIS — I10 Essential (primary) hypertension: Secondary | ICD-10-CM | POA: Diagnosis not present

## 2024-03-13 DIAGNOSIS — L539 Erythematous condition, unspecified: Secondary | ICD-10-CM | POA: Diagnosis not present

## 2024-03-13 DIAGNOSIS — C7801 Secondary malignant neoplasm of right lung: Secondary | ICD-10-CM | POA: Diagnosis not present

## 2024-03-13 DIAGNOSIS — C7802 Secondary malignant neoplasm of left lung: Secondary | ICD-10-CM | POA: Diagnosis not present

## 2024-03-13 DIAGNOSIS — L98499 Non-pressure chronic ulcer of skin of other sites with unspecified severity: Secondary | ICD-10-CM | POA: Diagnosis not present

## 2024-03-13 DIAGNOSIS — C50812 Malignant neoplasm of overlapping sites of left female breast: Secondary | ICD-10-CM | POA: Diagnosis not present

## 2024-03-25 DIAGNOSIS — L539 Erythematous condition, unspecified: Secondary | ICD-10-CM | POA: Diagnosis not present

## 2024-03-25 DIAGNOSIS — I10 Essential (primary) hypertension: Secondary | ICD-10-CM | POA: Diagnosis not present

## 2024-03-25 DIAGNOSIS — Z510 Encounter for antineoplastic radiation therapy: Secondary | ICD-10-CM | POA: Diagnosis not present

## 2024-03-25 DIAGNOSIS — M40209 Unspecified kyphosis, site unspecified: Secondary | ICD-10-CM | POA: Diagnosis not present

## 2024-03-25 DIAGNOSIS — C7801 Secondary malignant neoplasm of right lung: Secondary | ICD-10-CM | POA: Diagnosis not present

## 2024-03-25 DIAGNOSIS — L98499 Non-pressure chronic ulcer of skin of other sites with unspecified severity: Secondary | ICD-10-CM | POA: Diagnosis not present

## 2024-03-25 DIAGNOSIS — C50812 Malignant neoplasm of overlapping sites of left female breast: Secondary | ICD-10-CM | POA: Diagnosis not present

## 2024-03-25 DIAGNOSIS — C7802 Secondary malignant neoplasm of left lung: Secondary | ICD-10-CM | POA: Diagnosis not present

## 2024-03-30 ENCOUNTER — Ambulatory Visit: Admitting: Family Medicine

## 2024-04-07 ENCOUNTER — Other Ambulatory Visit (HOSPITAL_COMMUNITY): Payer: Self-pay

## 2024-04-07 ENCOUNTER — Other Ambulatory Visit: Payer: Self-pay

## 2024-04-08 ENCOUNTER — Ambulatory Visit: Admitting: Orthopaedic Surgery

## 2024-04-08 ENCOUNTER — Encounter: Payer: Self-pay | Admitting: Orthopaedic Surgery

## 2024-04-08 DIAGNOSIS — M8000XD Age-related osteoporosis with current pathological fracture, unspecified site, subsequent encounter for fracture with routine healing: Secondary | ICD-10-CM | POA: Diagnosis not present

## 2024-04-08 NOTE — Progress Notes (Signed)
 My cancer is worse.  She has breast cancer and at the end stage of treatment.  They have run out of options for her.  She accepts this.  She has no new increase pain in the back or pelvis.  She has no trauma.  She is confined to wheelchair most of the time.  She has no weakness.  She has pain but it is stable.  Lower back is diffusely tender but no spasm. Muscle tone and strength normal.  NV intact.  Encounter Diagnosis  Name Primary?   Age-related osteoporosis with current pathological fracture with routine healing, subsequent encounter Yes   I will see her in three months.  Call if any problem.  Precautions discussed.  Electronically Signed Lemond Stable, MD 7/23/20259:27 AM

## 2024-04-09 ENCOUNTER — Ambulatory Visit

## 2024-04-09 VITALS — BP 133/79 | HR 82 | Ht 61.0 in | Wt 145.1 lb

## 2024-04-09 DIAGNOSIS — R7301 Impaired fasting glucose: Secondary | ICD-10-CM | POA: Diagnosis not present

## 2024-04-09 DIAGNOSIS — I10 Essential (primary) hypertension: Secondary | ICD-10-CM | POA: Diagnosis not present

## 2024-04-09 DIAGNOSIS — E039 Hypothyroidism, unspecified: Secondary | ICD-10-CM

## 2024-04-09 NOTE — Progress Notes (Signed)
   Established Patient Office Visit  Subjective   Patient ID: DONNISHA BESECKER, female    DOB: 07/19/1941  Age: 83 y.o. MRN: [id number]  Chief Complaint  Patient presents with   Medical Management of Chronic Issues    4 month follow up     HPI  Patient Active Problem List   Diagnosis Date Noted   Osteoporosis 02/18/2024   Breast carcinoma, female, left (HCC) 02/17/2024   Rash 04/22/2023   Compression fracture of L2 lumbar vertebra, closed, initial encounter (HCC) 04/09/2023   Lumbar pain 04/09/2023   Age-related osteoporosis without current pathological fracture 08/03/2021   Fall 07/04/2021   Sciatica 02/28/2021   IFG (impaired fasting glucose) 10/26/2020   Hypothyroidism 01/04/2020   Essential hypertension 01/04/2020   Diverticulitis 06/22/2019   Prediabetes 06/22/2019   Allergies 06/22/2019   Encounter for general adult medical examination with abnormal findings 06/22/2019    ROS    Objective:     BP 133/79   Pulse 82   Ht 5' 1 (1.549 m)   Wt 145 lb 1.3 oz (65.8 kg)   SpO2 94%   BMI 27.41 kg/m  BP Readings from Last 3 Encounters:  04/09/24 133/79  03/05/24 133/66  02/17/24 139/89   Wt Readings from Last 3 Encounters:  04/09/24 145 lb 1.3 oz (65.8 kg)  03/05/24 146 lb (66.2 kg)  02/17/24 148 lb 14.4 oz (67.5 kg)      Physical Exam Vitals and nursing note reviewed.  Constitutional:      Appearance: Normal appearance.  HENT:     Head: Normocephalic.  Eyes:     Extraocular Movements: Extraocular movements intact.     Pupils: Pupils are equal, round, and reactive to light.  Cardiovascular:     Rate and Rhythm: Normal rate and regular rhythm.  Pulmonary:     Effort: Pulmonary effort is normal.     Breath sounds: Normal breath sounds.  Musculoskeletal:     Cervical back: Normal range of motion and neck supple.  Neurological:     Mental Status: She is alert and oriented to person, place, and time.  Psychiatric:        Mood and Affect: Mood  normal.        Thought Content: Thought content normal.       The ASCVD Risk score (Arnett DK, et al., 2019) failed to calculate for the following reasons:   The 2019 ASCVD risk score is only valid for ages 78 to 80    Assessment & Plan:   Problem List Items Addressed This Visit       Cardiovascular and Mediastinum   Essential hypertension - Primary   Controlled Asymptomatic in the clinic Not on medications Encouraged lifestyle modification with low-sodium diet and increase physical activity BP Readings from Last 3 Encounters:  04/09/24 133/79  03/05/24 133/66  02/17/24 139/89         Relevant Orders   CMP14+EGFR (Completed)     Endocrine   Hypothyroidism   Currently not on medications Pending thyroid  levels        Relevant Orders   TSH + free T4 (Completed)   IFG (impaired fasting glucose)   -will check A1c today      Relevant Orders   CMP14+EGFR (Completed)   HgB A1c (Completed)    No follow-ups on file.    Leita Longs, FNP

## 2024-04-10 LAB — TSH+FREE T4
Free T4: 1.07 ng/dL (ref 0.82–1.77)
TSH: 8.13 u[IU]/mL — ABNORMAL HIGH (ref 0.450–4.500)

## 2024-04-10 LAB — HEMOGLOBIN A1C
Est. average glucose Bld gHb Est-mCnc: 117 mg/dL
Hgb A1c MFr Bld: 5.7 % — ABNORMAL HIGH (ref 4.8–5.6)

## 2024-04-10 LAB — CMP14+EGFR
ALT: 8 IU/L (ref 0–32)
AST: 15 IU/L (ref 0–40)
Albumin: 3.7 g/dL (ref 3.7–4.7)
Alkaline Phosphatase: 93 IU/L (ref 44–121)
BUN/Creatinine Ratio: 24 (ref 12–28)
BUN: 12 mg/dL (ref 8–27)
Bilirubin Total: 0.3 mg/dL (ref 0.0–1.2)
CO2: 22 mmol/L (ref 20–29)
Calcium: 9.9 mg/dL (ref 8.7–10.3)
Chloride: 101 mmol/L (ref 96–106)
Creatinine, Ser: 0.5 mg/dL — ABNORMAL LOW (ref 0.57–1.00)
Globulin, Total: 3.2 g/dL (ref 1.5–4.5)
Glucose: 103 mg/dL — ABNORMAL HIGH (ref 70–99)
Potassium: 4.5 mmol/L (ref 3.5–5.2)
Sodium: 139 mmol/L (ref 134–144)
Total Protein: 6.9 g/dL (ref 6.0–8.5)
eGFR: 94 mL/min/1.73 (ref >59)

## 2024-04-18 ENCOUNTER — Ambulatory Visit: Payer: Self-pay

## 2024-04-18 NOTE — Assessment & Plan Note (Signed)
-  will check A1c today

## 2024-04-18 NOTE — Assessment & Plan Note (Signed)
 Controlled Asymptomatic in the clinic Not on medications Encouraged lifestyle modification with low-sodium diet and increase physical activity BP Readings from Last 3 Encounters:  04/09/24 133/79  03/05/24 133/66  02/17/24 139/89

## 2024-04-18 NOTE — Assessment & Plan Note (Signed)
 Currently not on medications Pending thyroid  levels

## 2024-04-30 ENCOUNTER — Encounter

## 2024-04-30 NOTE — Progress Notes (Signed)
 Patient declines any AWV's. She states she has terminal cancer and does not want to spend her time being aggravated.

## 2024-04-30 NOTE — Telephone Encounter (Signed)
 Acknowledged her desire not to restart levothyroxine .

## 2024-05-08 ENCOUNTER — Encounter: Payer: Self-pay | Admitting: Radiology

## 2024-05-10 NOTE — Progress Notes (Signed)
 Patient Care Team: Zarwolo, Gloria, FNP as PCP - General (Family Medicine)  Clinic Day:  05/12/2024  Referring physician: Zarwolo, Gloria, FNP   CHIEF COMPLAINT:  CC: Breast carcinoma   ASSESSMENT & PLAN:   Assessment & Plan: Natasha Willis  is a 83 y.o. 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 PET scan consistent with metastatic disease-lung and bone 03/09/2024-03/13/2024: Palliative radiation to left breast Patient still refuses to get biopsy and refuses to have further systemic treatment.  - Patient wishes to follow-up for further care and palliative needs. -Discussed with patient that not giving systemic therapy can result in progression of disease and ultimately death.  Patient understands the risks and chooses to have better quality of life over quantity with systemic therapy - Will refer to hospice today - Patient reported family history of breast cancer(LCIS) in one daughter and endometrial carcinoma in another.  Will refer for genetic counseling.  No further follow-up needed as patient will be referred to hospice.    The patient understands the plans discussed today and is in agreement with them.  She knows to contact our office if she develops concerns prior to her next appointment.  I provided 20 minutes of face-to-face time during this encounter and > 50% was spent counseling as documented under my assessment and plan.    Natasha Willis,acting as a Neurosurgeon for Natasha Dry, MD.,have documented all relevant documentation on the behalf of Natasha Dry, MD,as directed by  Natasha Dry, MD while in the presence of Natasha Dry, MD.  I, Natasha Dry MD, have reviewed the above documentation for accuracy and completeness, and I agree with the above.     Natasha Dry, MD  [facility] [place] - A  DEPT OF [facility] [address] [place] [address] Dept: [phone] Dept Fax: [phone]   No orders of the defined types were placed in this encounter.    ONCOLOGY HISTORY:   Oncology History  Breast carcinoma, female, left (HCC)  06/30/2019 Imaging   IMPRESSION:  1. Highly suspicious masses in the left breast at the 1 o'clock and 2 o'clock positions. 2.  Several morphologically abnormal lymph nodes in the left axilla. 3. Suspicious right breast mass at the 9 o'clock position with 2 indeterminate asymmetries medial to this mass.   02/17/2024 Initial Diagnosis   Breast carcinoma, female, left (HCC)   02/27/2024 PET scan   IMPRESSION: Very large invasive multilobular left breast mass identified with extension along the pectoralis major muscle.   Nodular areas of increased uptake along the lateral aspect of the right breast. A separate right-sided breast lesion is possible.   Multiple bilateral lung nodules. There are proximally 8 which show some level of abnormal uptake and worrisome for multifocal lung metastases.   Additional hypermetabolic soft tissue mass along the left chest wall immediately anterior to the sternum the junction to the left first rib.   Focus of abnormal uptake along the left eighth rib posteriorly, possible bony metastatic focus.   Large hiatal hernia. The entirety of the stomach is in the mediastinum. Colonic diverticula. Significant motion.    Tumor Marker   Patient's tumor was tested for the following markers:  CA 15-3: 132 CA 27.29: 167.7   03/05/2024 Cancer Staging   Staging form: Breast, AJCC 8th Edition - Pathologic stage from 03/05/2024: Stage IV (  cT4b, cN2, pM1, GX, ER: Unknown, PR: Unknown, HER2: Unknown) - Signed by Davonna Siad, MD on 03/05/2024 Stage prefix: Initial diagnosis Method of lymph node assessment: Clinical Nuclear grade: GX Multigene prognostic tests performed: None Histologic grading  system: 3 grade system Laterality: Left   03/09/2024 - 03/13/2024 Radiation Therapy   Palliative radiation to left breast       Current Treatment: Palliative radiation to the breast  INTERVAL HISTORY:  Natasha Willis is here today for follow up. Patient is accompanied by her daughter today.  Ms. Giannelli reports no complaints today.  She had good response to radiation treatment of her breast and she is not bleeding anymore.  She reported applying Neosporin to the breast.  We discussed that for further any kind of treatment she would need a biopsy.  Patient reported that she would not want a biopsy or further treatment.  We discussed referral to hospice for metastatic breast carcinoma and patient is willing to go through with that.   I have reviewed the past medical history, past surgical history, social history and family history with the patient and they are unchanged from previous note.  ALLERGIES:  is allergic to penicillins.  MEDICATIONS:  Current Outpatient Medications  Medication Sig Dispense Refill   carboxymethylcellulose 1 % ophthalmic solution 1 drop 3 (three) times daily.     Cholecalciferol (VITAMIN D ) 50 MCG (2000 UT) CAPS Take by mouth. Take one capsule daily     denosumab  (PROLIA ) 60 MG/ML SOSY injection Inject 60 mg into the skin every 6 (six) months. 1 mL 0   Fish Oil-Cholecalciferol (FISH OIL + D3) 1000-1000 MG-UNIT CAPS Take 1 capsule by mouth daily.      HYDROcodone -acetaminophen  (NORCO/VICODIN) 5-325 MG tablet One tablet every four hours for pain. 30 tablet 0   Iron , Ferrous Sulfate , 325 (65 Fe) MG TABS Take 325 mg by mouth daily. 30 tablet 2   Multiple Vitamins-Minerals (CENTRAL-VITE FOR SENIORS PO) Take 1 tablet by mouth daily.      tiZANidine  (ZANAFLEX ) 4 MG tablet Take 1 tablet (4 mg total) by mouth every 6 (six) hours as needed for muscle spasms. 60 tablet 0   No current facility-administered medications for this visit.     REVIEW OF SYSTEMS:    Constitutional: Denies fevers, chills or abnormal weight loss Eyes: Denies blurriness of vision Ears, nose, mouth, throat, and face: Denies mucositis or sore throat Respiratory: Denies cough, dyspnea or wheezes Cardiovascular: Denies palpitation, chest discomfort or lower extremity swelling Gastrointestinal:  Denies nausea, heartburn or change in bowel habits Skin: Denies abnormal skin rashes Lymphatics: Denies new lymphadenopathy or easy bruising Neurological:Denies numbness, tingling or new weaknesses Behavioral/Psych: Mood is stable, no new changes  All other systems were reviewed with the patient and are negative.   VITALS:  Height 5' 0.24 (1.53 m).  Wt Readings from Last 3 Encounters:  04/09/24 145 lb 1.3 oz (65.8 kg)  03/05/24 146 lb (66.2 kg)  02/17/24 148 lb 14.4 oz (67.5 kg)    Body mass index is 28.11 kg/m.  Performance status (ECOG): 3 - Symptomatic, >50% confined to bed  PHYSICAL EXAM:   GENERAL:alert, no distress and comfortable, in a wheelchair SKIN: skin color, texture, turgor are normal, no rashes or significant lesions BREAST: Left breast mass improved from prior.  No active bleeding at this time.  Picture below.  The left axillary lymph nodes are enlarged and matted.  No palpable right breast mass or axillary lymphadenopathy LYMPH:  no palpable lymphadenopathy  in the cervical and inguinal LUNGS: clear to auscultation and percussion with normal breathing effort HEART: regular rate & rhythm and no murmurs and no lower extremity edema ABDOMEN:abdomen soft, non-tender and normal bowel sounds Musculoskeletal:no cyanosis of digits and no clubbing  PSYCH: alert & oriented x 3 with fluent speech    LABORATORY DATA:  I have reviewed the data as listed    Component Value Date/Time   NA 139 04/09/2024 1006   K 4.5 04/09/2024 1006   CL 101 04/09/2024 1006   CO2 22 04/09/2024 1006   GLUCOSE 103 (H) 04/09/2024 1006   GLUCOSE 102 (H) 06/30/2020 0922   BUN 12  04/09/2024 1006   CREATININE 0.50 (L) 04/09/2024 1006   CREATININE 0.48 (L) 06/30/2020 0922   CALCIUM 9.9 04/09/2024 1006   PROT 6.9 04/09/2024 1006   ALBUMIN 3.7 04/09/2024 1006   AST 15 04/09/2024 1006   ALT 8 04/09/2024 1006   ALKPHOS 93 04/09/2024 1006   BILITOT 0.3 04/09/2024 1006   GFRNONAA 84 10/26/2020 0906   GFRNONAA 94 06/30/2020 0922   GFRAA 96 10/26/2020 0906   GFRAA 109 06/30/2020 0922    Lab Results  Component Value Date   WBC 8.4 11/29/2023   NEUTROABS 6.1 11/29/2023   HGB 12.0 11/29/2023   HCT 37.8 11/29/2023   MCV 89 11/29/2023   PLT 321 11/29/2023      Chemistry      Component Value Date/Time   NA 139 04/09/2024 1006   K 4.5 04/09/2024 1006   CL 101 04/09/2024 1006   CO2 22 04/09/2024 1006   BUN 12 04/09/2024 1006   CREATININE 0.50 (L) 04/09/2024 1006   CREATININE 0.48 (L) 06/30/2020 0922      Component Value Date/Time   CALCIUM 9.9 04/09/2024 1006   ALKPHOS 93 04/09/2024 1006   AST 15 04/09/2024 1006   ALT 8 04/09/2024 1006   BILITOT 0.3 04/09/2024 1006      Latest Reference Range & Units 02/17/24 11:43  CA 15-3 0.0 - 25.0 U/mL 132.0 (H)  CA 27.29 0.0 - 38.6 U/mL 167.7 (H)  (H): Data is abnormally high  RADIOGRAPHIC STUDIES: I have personally reviewed the radiological images as listed and agreed with the findings in the report.  No results found.

## 2024-05-11 ENCOUNTER — Other Ambulatory Visit: Payer: Self-pay

## 2024-05-11 NOTE — Assessment & Plan Note (Addendum)
 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 Patient does not wish to get any systemic therapy at this point and just wants to get palliative radiation.  She does not want to get biopsy to confirm the diagnosis and type of the disease.  - Continue with radiation oncology for palliative radiation - Patient wishes to follow-up for further care and palliative needs. -Discussed with patient that not giving systemic therapy can result in progression of disease and ultimately death.  Patient understands the risks and chooses to have better quality of life over quantity with systemic therapy  Return to clinic in 2 months

## 2024-05-12 ENCOUNTER — Other Ambulatory Visit: Payer: Self-pay

## 2024-05-12 ENCOUNTER — Inpatient Hospital Stay: Attending: Oncology | Admitting: Oncology

## 2024-05-12 ENCOUNTER — Other Ambulatory Visit (HOSPITAL_COMMUNITY): Payer: Self-pay

## 2024-05-12 VITALS — Ht 60.24 in

## 2024-05-12 DIAGNOSIS — C50912 Malignant neoplasm of unspecified site of left female breast: Secondary | ICD-10-CM | POA: Diagnosis not present

## 2024-05-12 DIAGNOSIS — C50412 Malignant neoplasm of upper-outer quadrant of left female breast: Secondary | ICD-10-CM | POA: Diagnosis not present

## 2024-05-12 DIAGNOSIS — R918 Other nonspecific abnormal finding of lung field: Secondary | ICD-10-CM | POA: Diagnosis not present

## 2024-05-12 DIAGNOSIS — Z23 Encounter for immunization: Secondary | ICD-10-CM | POA: Diagnosis not present

## 2024-05-12 DIAGNOSIS — M810 Age-related osteoporosis without current pathological fracture: Secondary | ICD-10-CM

## 2024-05-12 MED ORDER — PROLIA 60 MG/ML ~~LOC~~ SOSY
60.0000 mg | PREFILLED_SYRINGE | SUBCUTANEOUS | 0 refills | Status: AC
  Filled 2024-05-12: qty 1, 180d supply, fill #0

## 2024-05-12 NOTE — Patient Instructions (Signed)
 [facility] Discharge Instructions   You were seen and examined today by Dr. Davonna.  She reviewed the results of your lab work which are normal/stable.   We will refer you to hospice. We will also refer you for genetic testing.    We will not plan to schedule any follow up here at this time.    Thank you for choosing [facility] at [facility] to provide your oncology and hematology care.  To afford each patient quality time with our provider, please arrive at least 15 minutes before your scheduled appointment time.   If you have a lab appointment with the Cancer Center please come in thru the Main Entrance and check in at the main information desk.  You need to re-schedule your appointment should you arrive 10 or more minutes late.  We strive to give you quality time with our providers, and arriving late affects you and other patients whose appointments are after yours.  Also, if you no show three or more times for appointments you may be dismissed from the clinic at the providers discretion.     Again, thank you for choosing [facility].  Our hope is that these requests will decrease the amount of time that you wait before being seen by our physicians.       _____________________________________________________________  Should you have questions after your visit to [facility], please contact our office at [phone] and follow the prompts.  Our office hours are 8:00 a.m. and 4:30 p.m. Monday - Friday.  Please note that voicemails left after 4:00 p.m. may not be returned until the following business day.  We are closed weekends and major holidays.  You do have access to a nurse 24-7, just call the main number to the clinic [phone] and do not press any options, hold on the line and a nurse will answer the phone.    For prescription refill requests, have your pharmacy contact our office and allow 72  hours.    Due to Covid, you will need to wear a mask upon entering the hospital. If you do not have a mask, a mask will be given to you at the Main Entrance upon arrival. For doctor visits, patients may have 1 support person age 34 or older with them. For treatment visits, patients can not have anyone with them due to social distancing guidelines and our immunocompromised population.

## 2024-05-13 ENCOUNTER — Other Ambulatory Visit (HOSPITAL_COMMUNITY): Payer: Self-pay

## 2024-05-14 ENCOUNTER — Other Ambulatory Visit: Payer: Self-pay

## 2024-05-15 ENCOUNTER — Other Ambulatory Visit: Payer: Self-pay

## 2024-05-15 ENCOUNTER — Other Ambulatory Visit: Payer: Self-pay | Admitting: Pharmacy Technician

## 2024-05-15 NOTE — Progress Notes (Signed)
 Specialty Pharmacy Refill Coordination Note  Natasha Willis is a 83 y.o. female contacted today regarding refills of specialty medication(s) Denosumab  (Prolia )   Patient requested Courier to Provider Office   Delivery date: 05/19/24   [place] [address], [place] [address]   Medication will be filled on 05/15/24.  Injection appointment 9/4.  Copay $12.15

## 2024-05-21 ENCOUNTER — Ambulatory Visit: Admitting: "Endocrinology

## 2024-05-21 ENCOUNTER — Encounter: Payer: Self-pay | Admitting: "Endocrinology

## 2024-05-21 VITALS — BP 148/88 | HR 64

## 2024-05-21 DIAGNOSIS — M810 Age-related osteoporosis without current pathological fracture: Secondary | ICD-10-CM

## 2024-05-21 DIAGNOSIS — E039 Hypothyroidism, unspecified: Secondary | ICD-10-CM

## 2024-05-21 MED ORDER — DENOSUMAB 60 MG/ML ~~LOC~~ SOSY
60.0000 mg | PREFILLED_SYRINGE | SUBCUTANEOUS | Status: AC
  Administered 2024-05-21: 60 mg via SUBCUTANEOUS

## 2024-05-21 NOTE — Progress Notes (Signed)
 Pt seen today for follow up with Dr.Nida and to receive Prolia  injection. Pt provided Prolia  ( Lot #[id number], Exp. Date 09/16/2026). Prolia  60mg  given SQ in upper R arm without difficulty. Pt waited the required 15 minutes post injection, no adverse reactions noted.

## 2024-05-21 NOTE — Progress Notes (Signed)
 05/21/2024, 1:17 PM  Endocrinology follow-up note   Subjective:    Patient ID: Natasha Willis, female    DOB: [birth year], PCP Zarwolo, Gloria, FNP   Past Medical History:  Diagnosis Date   Asthma    Diverticulitis    Hypertension    Thyroid  disease    Past Surgical History:  Procedure Laterality Date   ABDOMINAL HYSTERECTOMY     ANTERIOR AND POSTERIOR REPAIR     APPENDECTOMY     CATARACT EXTRACTION W/PHACO Right 08/19/2014   Procedure: CATARACT EXTRACTION PHACO AND INTRAOCULAR LENS PLACEMENT RIGHT EYE;  Surgeon: Cherene Mania, MD;  Location: [facility];  Service: Ophthalmology;  Laterality: Right;  CDE:7.60   CATARACT EXTRACTION W/PHACO Left 08/30/2014   Procedure: CATARACT EXTRACTION PHACO AND INTRAOCULAR LENS PLACEMENT (IOC);  Surgeon: Cherene Mania, MD;  Location: [facility];  Service: Ophthalmology;  Laterality: Left;  CDE:5.49   CHOLECYSTECTOMY     TUBAL LIGATION     Social History   Socioeconomic History   Marital status: Legally Separated    Spouse name: Not on file   Number of children: 5   Years of education: Not on file   Highest education level: 10th grade  Occupational History   Not on file  Tobacco Use   Smoking status: Never   Smokeless tobacco: Never  Substance and Sexual Activity   Alcohol use: No   Drug use: No   Sexual activity: Not Currently    Birth control/protection: Surgical  Other Topics Concern   Not on file  Social History Narrative   Lives alone   5 children, one lives 2 doors down.   [place], [employer], and 2 in [place]      Cat: Vila      Enjoys: reading-bible, christian books      Diet: strict diet (avoid sugar)   Caffeine: tea daily   Water: 6-8 cups       Wears seat belt- does not driving, daughter drives her    Psychologist, sport and exercise at home   No weapons   Social Drivers of Health   Financial Resource Strain: Low Risk  (04/05/2024)   Overall Financial Resource Strain (CARDIA)    Difficulty  of Paying Living Expenses: Not hard at all  Food Insecurity: No Food Insecurity (04/05/2024)   Hunger Vital Sign    Worried About Running Out of Food in the Last Year: Never true    Ran Out of Food in the Last Year: Never true  Transportation Needs: No Transportation Needs (04/05/2024)   PRAPARE - Administrator, Civil Service (Medical): No    Lack of Transportation (Non-Medical): No  Physical Activity: Insufficiently Active (04/05/2024)   Exercise Vital Sign    Days of Exercise per Week: 3 days    Minutes of Exercise per Session: 10 min  Stress: No Stress Concern Present (04/05/2024)   [employer] of Occupational Health - Occupational Stress Questionnaire    Feeling of Stress: Not at all  Social Connections: Moderately Isolated (04/05/2024)   Social Connection and Isolation Panel    Frequency of Communication with Friends and Family: More than three times a week    Frequency of Social Gatherings with Friends and Family: More than three times  a week    Attends Religious Services: 1 to 4 times per year    Active Member of Clubs or Organizations: No    Attends Engineer, structural: Not on file    Marital Status: Divorced   Family History  Problem Relation Age of Onset   Diabetes Brother    Heart disease Brother    Diabetes Brother    Heart disease Brother    Outpatient Encounter Medications as of 05/21/2024  Medication Sig   carboxymethylcellulose 1 % ophthalmic solution 1 drop 3 (three) times daily.   Cholecalciferol (VITAMIN D ) 50 MCG (2000 UT) CAPS Take by mouth. Take one capsule daily   denosumab  (PROLIA ) 60 MG/ML SOSY injection Inject 60 mg into the skin every 6 (six) months.   Fish Oil-Cholecalciferol (FISH OIL + D3) 1000-1000 MG-UNIT CAPS Take 1 capsule by mouth daily.    HYDROcodone -acetaminophen  (NORCO/VICODIN) 5-325 MG tablet One tablet every four hours for pain.   Iron , Ferrous Sulfate , 325 (65 Fe) MG TABS Take 325 mg by mouth daily.   Multiple  Vitamins-Minerals (CENTRAL-VITE FOR SENIORS PO) Take 1 tablet by mouth daily.    tiZANidine  (ZANAFLEX ) 4 MG tablet Take 1 tablet (4 mg total) by mouth every 6 (six) hours as needed for muscle spasms.   Facility-Administered Encounter Medications as of 05/21/2024  Medication   denosumab  (PROLIA ) injection 60 mg   ALLERGIES: Allergies  Allergen Reactions   Penicillins     VACCINATION STATUS:  There is no immunization history on file for this patient.  HPI Natasha Willis is 83 y.o. female who presents today with a medical history as above. she is being seen in follow-up after she was seen in consultation for osteoporosis requested by Zarwolo, Gloria, FNP.  She is on Prolia  treatment every 6 months.  She has tolerated her prior treatments. She is accompanied by her daughter to clinic.   See notes from her previous visit. Her medical history significant for chronic lumbar pain, compression fracture of L2 lumbar vertebra, T12 as well as L1. She is a survivor of childhood polio.  She is wheelchair-bound due to disequilibrium as well as kyphoscoliosis. Her bone density from November 2022  showed -3.4 T-score on right femur -2.7 on right forearm. Lumbar spine was not included due to degenerative joint disease.    Her interim bone density from March 2025 STILL consistent with significant bone loss with T-score of -4.1 on right femur, -4.2%  on right forearm. She is on opioids for pain control as well as vitamin D  treatment for vitamin D  deficiency.  During her last visit, she was offered treatment with Prolia -status post 3 treatments already tolerating reasonably well. She denies any exposure to  high-dose steroids.  She denies family history of osteoporosis.  Her medical history also includes prediabetes, subclinical hypothyroidism, not on treatment.  She denies any history of parathyroid dysfunction.  No history of CKD nor liver disease. Review of Systems  Constitutional: + Mildly  fluctuating body weight,  no fatigue, no subjective hyperthermia, no subjective hypothermia Eyes: no blurry vision, no xerophthalmia   Objective:       05/21/2024   10:40 AM 05/12/2024    2:19 PM 04/09/2024    9:20 AM  Vitals with BMI  Height  5' 0.236 5' 1  Weight   145 lbs 1 oz  BMI   27.43  Systolic 148  133  Diastolic 88  79  Pulse 64  82    BP (!) 148/88  Pulse 64   Wt Readings from Last 3 Encounters:  04/09/24 145 lb 1.3 oz (65.8 kg)  03/05/24 146 lb (66.2 kg)  02/17/24 148 lb 14.4 oz (67.5 kg)    Physical Exam  Constitutional: + Wheelchair-bound, not in acute distress, normal state of mind  Musculoskeletal: + Significant kyphoscoliosis of thoracolumbar spine,  strength intact in all four extremities, no peripheral edema, wheelchair-bound at baseline   CMP ( most recent) CMP     Component Value Date/Time   NA 139 04/09/2024 1006   K 4.5 04/09/2024 1006   CL 101 04/09/2024 1006   CO2 22 04/09/2024 1006   GLUCOSE 103 (H) 04/09/2024 1006   GLUCOSE 102 (H) 06/30/2020 0922   BUN 12 04/09/2024 1006   CREATININE 0.50 (L) 04/09/2024 1006   CREATININE 0.48 (L) 06/30/2020 0922   CALCIUM 9.9 04/09/2024 1006   PROT 6.9 04/09/2024 1006   ALBUMIN 3.7 04/09/2024 1006   AST 15 04/09/2024 1006   ALT 8 04/09/2024 1006   ALKPHOS 93 04/09/2024 1006   BILITOT 0.3 04/09/2024 1006   EGFR 94 04/09/2024 1006   GFRNONAA 84 10/26/2020 0906   GFRNONAA 94 06/30/2020 0922     Diabetic Labs (most recent): Lab Results  Component Value Date   HGBA1C 5.7 (H) 04/09/2024   HGBA1C 5.2 11/29/2023   HGBA1C 5.7 (H) 07/30/2023     Lipid Panel ( most recent) Lipid Panel     Component Value Date/Time   CHOL 132 11/29/2023 1021   TRIG 72 11/29/2023 1021   HDL 48 11/29/2023 1021   CHOLHDL 2.8 11/29/2023 1021   CHOLHDL 3.5 12/28/2019 0838   LDLCALC 69 11/29/2023 1021   LDLCALC 107 (H) 12/28/2019 0838   LABVLDL 15 11/29/2023 1021      Lab Results  Component Value Date   TSH  8.130 (H) 04/09/2024   TSH 2.770 11/29/2023   TSH 2.790 11/29/2023   TSH 7.890 (H) 07/30/2023   TSH 4.850 (H) 04/22/2023   FREET4 1.07 04/09/2024   FREET4 0.98 11/29/2023   FREET4 1.01 11/29/2023   FREET4 1.04 07/30/2023   FREET4 1.05 04/22/2023    Most recent bone density from November 26, 2023 DualFemur Total Right 11/26/2023 81.6 Osteoporosis -4.1 0.493 g/cm2 -14.3% Yes DualFemur Total Right 07/18/2021 79.3 Osteoporosis -3.4 0.575 g/cm2 - -   DualFemur Total Mean 11/26/2023 81.6 Osteoporosis -4.0 0.507 g/cm2 -16.5% Yes DualFemur Total Mean 07/18/2021 79.3 Osteoporosis -3.2 0.607 g/cm2 - -   Right Forearm Radius 33% 11/26/2023 81.6 Osteoporosis -4.2 0.414 g/cm2 -20.8% Yes Right Forearm Radius 33% 07/18/2021 79.3 Osteoporosis -2.7 0.523 g/cm2 - -   ASSESSMENT: The BMD measured at Forearm Radius 33% is 0.414 g/cm2 with a T-score of -4.2. This patient's diagnostic category is OSTEOPOROSIS according to World Health Organization [facility]) criteria. The scan quality is good.   Comparison to 07/18/2021. Since the prior study, there has been a SIGNIFICANT DECREASE in bone mineral density of the RIGHT forearm (-20.8%) and bilateral hips total mean (-18.6%).  Assessment & Plan:   1. 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 - Based on these reviews, she has severe osteoporosis of hips and forearm.  Her osteoporosis is complicated by multiple compression fractures of the spine.  I reviewed her most recent bone density from March 2025 which showed continued bone loss-see above.  She will  continue to benefit from Prolia  intervention.  She has tolerated her previous 3 treatments, recommended for her to get her fourth treatment today and every 6 months.   She remains at high risk for fractures.  Particularly high risk for  rapid bone loss if she stops Prolia  without a backup treatment.  Her calcium/vitamin D  status is acceptable.   -Fall precautions discussed with her.  Her next labs will include comprehensive metabolic panel She is advised to increase her protein intake and her nutrition.   - she is advised to maintain close follow up with Zarwolo, Gloria, FNP for primary care needs.  I spent  21  minutes in the care of the patient today including review of labs from Thyroid  Function, CMP, and other relevant labs ; imaging/biopsy records (current and previous including abstractions from other facilities); face-to-face time discussing  her lab results and symptoms, medications doses, her options of short and long term treatment based on the latest standards of care / guidelines;   and documenting the encounter.  Natasha Willis  participated in the discussions, expressed understanding, and voiced agreement with the above plans.  All questions were answered to her satisfaction. she is encouraged to contact clinic should she have any questions or concerns prior to her return visit.   Follow up plan: Return in about 6 months (around 11/18/2024) for Prolia  Today (ASAP) and Prolia  NV.   Ranny Earl, MD [facility] [address] [place], [place] [address] Phone: [phone]  Fax: [phone]     05/21/2024, 1:17 PM  This note was partially dictated with voice recognition software. Similar sounding words can be transcribed inadequately or may not  be corrected upon review.

## 2024-05-22 ENCOUNTER — Ambulatory Visit: Admitting: "Endocrinology

## 2024-05-28 ENCOUNTER — Inpatient Hospital Stay: Admitting: Licensed Clinical Social Worker

## 2024-05-28 ENCOUNTER — Other Ambulatory Visit

## 2024-07-08 ENCOUNTER — Ambulatory Visit: Admitting: Orthopaedic Surgery

## 2024-07-08 ENCOUNTER — Ambulatory Visit: Admitting: Orthopedic Surgery

## 2024-07-08 DIAGNOSIS — M545 Low back pain, unspecified: Secondary | ICD-10-CM | POA: Diagnosis not present

## 2024-07-08 NOTE — Patient Instructions (Signed)
 Please call if you need any assistance.  We can schedule a follow up when you need an appointment

## 2024-07-10 ENCOUNTER — Encounter: Payer: Self-pay | Admitting: Orthopedic Surgery

## 2024-07-10 NOTE — Progress Notes (Signed)
 Orthopaedic Clinic Return  Assessment: Natasha Willis is a 83 y.o. female with the following: Chronic low back pain  Plan: Natasha Willis has a history of multiple vertebral compression fractures, sustained several years ago.  She has been dealing with this to some degree for several years.  However, she reports that over the past 1-2 months, she notes improvement.  She has been receiving injections to help with osteoporosis for the past 18 months.  This may be contributing.  Urged her to continue to ambulate safely.  She was able to stand in clinic today.  I am pleased with her improvements.  Nothing further is needed at this time.  She will follow-up as needed.  Follow-up: Return if symptoms worsen or fail to improve.   Subjective:  Chief Complaint  Patient presents with   Back Pain    Seen by Dr. Brenna for sciatica, pt is also under hospice care for cancer.     History of Present Illness: Natasha Willis is a 83 y.o. female who returns to clinic for repeat evaluation of chronic back pain.  She sustained multiple compression fracture several years ago.  She had been seeing Dr. Brenna.  She has started at the osteoporosis clinic, and has now received 3 injections.  Over the past 1-2 months, she reports that her back is feeling much better.  She is able to stand up straighter.  She is pleased with the recent improvements.  Review of Systems: No fevers or chills No numbness or tingling No chest pain No shortness of breath No bowel or bladder dysfunction No GI distress No headaches   Objective: There were no vitals taken for this visit.  Physical Exam:  Seated in wheelchair.  She is able to stand with assistance  No tenderness in the low back.  Sensation intact distally.  She is able to maintain straight leg raise bilaterally.  IMAGING: I personally ordered and reviewed the following images:  No new imaging obtained today.  Oneil DELENA Horde, MD 07/10/2024 9:23  AM

## 2024-07-20 ENCOUNTER — Encounter: Payer: Self-pay | Admitting: Radiology

## 2024-08-06 NOTE — Progress Notes (Signed)
 Natasha Willis                                          MRN: [id number]   08/06/2024   The [facility] Quality Team Specialist reviewed this patient medical record for the purposes of chart review for care gap closure. The following were reviewed: chart review for care gap closure-controlling blood pressure.    [facility] Quality Team

## 2024-08-10 ENCOUNTER — Encounter: Payer: Self-pay | Admitting: Family Medicine

## 2024-08-10 ENCOUNTER — Ambulatory Visit: Admitting: Family Medicine

## 2024-08-10 VITALS — BP 151/84 | HR 79 | Ht 61.0 in | Wt 156.0 lb

## 2024-08-10 DIAGNOSIS — I10 Essential (primary) hypertension: Secondary | ICD-10-CM | POA: Diagnosis not present

## 2024-08-10 DIAGNOSIS — E038 Other specified hypothyroidism: Secondary | ICD-10-CM | POA: Diagnosis not present

## 2024-08-10 NOTE — Progress Notes (Signed)
 Established Patient Office Visit  Subjective:  Patient ID: Natasha Willis, female    DOB: 05-10-1941  Age: 83 y.o. MRN: [id number]  CC:  Chief Complaint  Patient presents with   Hypertension    Four month follow up    HPI Natasha Willis is a 83 y.o. female with past medical history of HTN, Hypothyroidism presents for f/u of  chronic medical conditions.  For the details of today's visit, please refer to the assessment and plan.    Past Medical History:  Diagnosis Date   Asthma    Diverticulitis    Hypertension    Thyroid  disease     Past Surgical History:  Procedure Laterality Date   ABDOMINAL HYSTERECTOMY     ANTERIOR AND POSTERIOR REPAIR     APPENDECTOMY     CATARACT EXTRACTION W/PHACO Right 08/19/2014   Procedure: CATARACT EXTRACTION PHACO AND INTRAOCULAR LENS PLACEMENT RIGHT EYE;  Surgeon: Cherene Mania, MD;  Location: [facility];  Service: Ophthalmology;  Laterality: Right;  CDE:7.60   CATARACT EXTRACTION W/PHACO Left 08/30/2014   Procedure: CATARACT EXTRACTION PHACO AND INTRAOCULAR LENS PLACEMENT (IOC);  Surgeon: Cherene Mania, MD;  Location: [facility];  Service: Ophthalmology;  Laterality: Left;  CDE:5.49   CHOLECYSTECTOMY     TUBAL LIGATION      Family History  Problem Relation Age of Onset   Diabetes Brother    Heart disease Brother    Diabetes Brother    Heart disease Brother     Social History   Socioeconomic History   Marital status: Legally Separated    Spouse name: Not on file   Number of children: 5   Years of education: Not on file   Highest education level: 10th grade  Occupational History   Not on file  Tobacco Use   Smoking status: Never   Smokeless tobacco: Never  Substance and Sexual Activity   Alcohol use: No   Drug use: No   Sexual activity: Not Currently    Birth control/protection: Surgical  Other Topics Concern   Not on file  Social History Narrative   Lives alone   5 children, one lives 2 doors down.   [place], [employer], and  2 in [place]      Cat: Vila      Enjoys: reading-bible, christian books      Diet: strict diet (avoid sugar)   Caffeine: tea daily   Water: 6-8 cups       Wears seat belt- does not driving, daughter drives her    Psychologist, sport and exercise at home   No weapons   Social Drivers of Health   Financial Resource Strain: Low Risk  (08/08/2024)   Overall Financial Resource Strain (CARDIA)    Difficulty of Paying Living Expenses: Not hard at all  Food Insecurity: No Food Insecurity (08/08/2024)   Hunger Vital Sign    Worried About Running Out of Food in the Last Year: Never true    Ran Out of Food in the Last Year: Never true  Transportation Needs: No Transportation Needs (08/08/2024)   PRAPARE - Administrator, Civil Service (Medical): No    Lack of Transportation (Non-Medical): No  Physical Activity: Insufficiently Active (08/08/2024)   Exercise Vital Sign    Days of Exercise per Week: 4 days    Minutes of Exercise per Session: 20 min  Stress: No Stress Concern Present (08/08/2024)   [employer] of Occupational Health - Occupational Stress Questionnaire    Feeling  of Stress: Not at all  Social Connections: Unknown (08/08/2024)   Social Connection and Isolation Panel    Frequency of Communication with Friends and Family: More than three times a week    Frequency of Social Gatherings with Friends and Family: Three times a week    Attends Religious Services: More than 4 times per year    Active Member of Clubs or Organizations: Yes    Attends Banker Meetings: Never    Marital Status: Patient declined  Intimate Partner Violence: Not At Risk (02/17/2024)   Humiliation, Afraid, Rape, and Kick questionnaire    Fear of Current or Ex-Partner: No    Emotionally Abused: No    Physically Abused: No    Sexually Abused: No    Outpatient Medications Prior to Visit  Medication Sig Dispense Refill   carboxymethylcellulose 1 % ophthalmic solution 1 drop 3 (three) times daily.      Cholecalciferol (VITAMIN D ) 50 MCG (2000 UT) CAPS Take by mouth. Take one capsule daily     denosumab  (PROLIA ) 60 MG/ML SOSY injection Inject 60 mg into the skin every 6 (six) months. 1 mL 0   Fish Oil-Cholecalciferol (FISH OIL + D3) 1000-1000 MG-UNIT CAPS Take 1 capsule by mouth daily.      HYDROcodone -acetaminophen  (NORCO/VICODIN) 5-325 MG tablet One tablet every four hours for pain. 30 tablet 0   Iron , Ferrous Sulfate , 325 (65 Fe) MG TABS Take 325 mg by mouth daily. 30 tablet 2   Multiple Vitamins-Minerals (CENTRAL-VITE FOR SENIORS PO) Take 1 tablet by mouth daily.      tiZANidine  (ZANAFLEX ) 4 MG tablet Take 1 tablet (4 mg total) by mouth every 6 (six) hours as needed for muscle spasms. 60 tablet 0   Facility-Administered Medications Prior to Visit  Medication Dose Route Frequency Provider Last Rate Last Admin   denosumab  (PROLIA ) injection 60 mg  60 mg Subcutaneous Q6 months Nida, Gebreselassie W, MD   60 mg at 05/21/24 1150    Allergies  Allergen Reactions   Penicillins     ROS Review of Systems  Constitutional:  Negative for chills and fever.  Eyes:  Negative for visual disturbance.  Respiratory:  Negative for chest tightness and shortness of breath.   Neurological:  Negative for dizziness and headaches.      Objective:    Physical Exam HENT:     Head: Normocephalic.     Mouth/Throat:     Mouth: Mucous membranes are moist.  Cardiovascular:     Rate and Rhythm: Normal rate.     Heart sounds: Normal heart sounds.  Pulmonary:     Effort: Pulmonary effort is normal.     Breath sounds: Normal breath sounds.  Neurological:     Mental Status: She is alert.     BP (!) 151/84   Pulse 79   Ht 5' 1 (1.549 m)   Wt 156 lb (70.8 kg)   SpO2 95%   BMI 29.48 kg/m  Wt Readings from Last 3 Encounters:  08/10/24 156 lb (70.8 kg)  04/09/24 145 lb 1.3 oz (65.8 kg)  03/05/24 146 lb (66.2 kg)    Lab Results  Component Value Date   TSH 6.210 (H) 08/10/2024   Lab  Results  Component Value Date   WBC 8.4 11/29/2023   HGB 12.0 11/29/2023   HCT 37.8 11/29/2023   MCV 89 11/29/2023   PLT 321 11/29/2023   Lab Results  Component Value Date   NA 139 04/09/2024  K 4.5 04/09/2024   CO2 22 04/09/2024   GLUCOSE 103 (H) 04/09/2024   BUN 12 04/09/2024   CREATININE 0.50 (L) 04/09/2024   BILITOT 0.3 04/09/2024   ALKPHOS 93 04/09/2024   AST 15 04/09/2024   ALT 8 04/09/2024   PROT 6.9 04/09/2024   ALBUMIN 3.7 04/09/2024   CALCIUM 9.9 04/09/2024   ANIONGAP 11 08/17/2014   EGFR 94 04/09/2024   Lab Results  Component Value Date   CHOL 132 11/29/2023   Lab Results  Component Value Date   HDL 48 11/29/2023   Lab Results  Component Value Date   LDLCALC 69 11/29/2023   Lab Results  Component Value Date   TRIG 72 11/29/2023   Lab Results  Component Value Date   CHOLHDL 2.8 11/29/2023   Lab Results  Component Value Date   HGBA1C 5.7 (H) 04/09/2024      Assessment & Plan:  Essential hypertension Assessment & Plan: Controlled Blood Pressure in Clinic The patient's blood pressure is controlled in the clinic today. Encouraged to continue treatment regimen as is The patient remains asymptomatic. Advised to maintain a low-sodium diet and increase physical activity as tolerated.    Other specified hypothyroidism Assessment & Plan: Encouraged to continue treatment regimen as is The patient is encouraged to follow up if experiencing symptoms of hypothyroidism, despite compliance with the current treatment regimen, such as fatigue, weight gain, constipation, dry skin, cold intolerance, hair loss, or depression.    TSH (thyroid -stimulating hormone deficiency) -     TSH + free T4  Note: This chart has been completed using Engineer, Civil (consulting) software, and while attempts have been made to ensure accuracy, certain words and phrases may not be transcribed as intended.    Follow-up: Return in about 5 months (around 01/08/2025).   Prairie Stenberg  Z  Bacchus, FNP

## 2024-08-10 NOTE — Patient Instructions (Signed)
 I appreciate the opportunity to provide care to you today!    Follow up:  5 months  Labs: please stop by the lab today to get your blood drawn (TSH)  For a Healthier YOU, I Recommend: Reducing your intake of sugar, sodium, carbohydrates, and saturated fats. Increasing your fiber intake by incorporating more whole grains, fruits, and vegetables into your meals. Setting healthy goals with a focus on lowering your consumption of carbs, sugar, and unhealthy fats. Adding variety to your diet by including a wide range of fruits and vegetables. Cutting back on soda and limiting processed foods as much as possible. Staying active: In addition to taking your weight loss medication, aim for at least 150 minutes of moderate-intensity physical activity each week for optimal results.   Please follow up if your symptoms worsen or fail to improve.    Please continue to a heart-healthy diet and increase your physical activities. Try to exercise for at least five days a week.    It was a pleasure to see you and I look forward to continuing to work together on your health and well-being. Please do not hesitate to call the office if you need care or have questions about your care.  In case of emergency, please visit the Emergency Department for urgent care, or contact our clinic at [phone] to schedule an appointment. We're here to help you!   Have a wonderful day and week. With Gratitude, Meade JENEANE Gerlach MSN, FNP-BC, PMHNP-BC

## 2024-08-11 LAB — TSH+FREE T4
Free T4: 1.21 ng/dL (ref 0.82–1.77)
TSH: 6.21 u[IU]/mL — ABNORMAL HIGH (ref 0.450–4.500)

## 2024-08-16 NOTE — Assessment & Plan Note (Signed)
 Controlled Blood Pressure in Clinic The patient's blood pressure is controlled in the clinic today. Encouraged to continue treatment regimen as is The patient remains asymptomatic. Advised to maintain a low-sodium diet and increase physical activity as tolerated.

## 2024-08-16 NOTE — Assessment & Plan Note (Signed)
 Encouraged to continue treatment regimen as is The patient is encouraged to follow up if experiencing symptoms of hypothyroidism, despite compliance with the current treatment regimen, such as fatigue, weight gain, constipation, dry skin, cold intolerance, hair loss, or depression.

## 2024-11-18 ENCOUNTER — Ambulatory Visit: Admitting: "Endocrinology

## 2025-01-11 ENCOUNTER — Ambulatory Visit: Admitting: Family Medicine
